# Patient Record
Sex: Female | Born: 1990 | Race: White | Hispanic: No | Smoking: Former smoker
Health system: Southern US, Community
[De-identification: ages and names within clinical notes are randomized; demographics above are authoritative.]

## PROBLEM LIST (undated history)

## (undated) DIAGNOSIS — R87619 Unspecified abnormal cytological findings in specimens from cervix uteri: Secondary | ICD-10-CM

---
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
## 2009-02-15 ENCOUNTER — Emergency Department: Payer: Self-pay | Admitting: Unknown Physician Specialty

## 2011-07-15 ENCOUNTER — Emergency Department: Payer: Self-pay | Admitting: Emergency Medicine

## 2012-12-03 ENCOUNTER — Ambulatory Visit: Payer: Self-pay | Admitting: Obstetrics and Gynecology

## 2012-12-03 ENCOUNTER — Observation Stay: Payer: Self-pay | Admitting: Obstetrics and Gynecology

## 2012-12-03 LAB — CBC WITH DIFFERENTIAL/PLATELET
Basophil #: 0 10*3/uL (ref 0.0–0.1)
Eosinophil #: 0.1 10*3/uL (ref 0.0–0.7)
HCT: 36.9 % (ref 35.0–47.0)
HGB: 12.4 g/dL (ref 12.0–16.0)
Lymphocyte #: 1.5 10*3/uL (ref 1.0–3.6)
Lymphocyte %: 13.6 %
MCH: 31.5 pg (ref 26.0–34.0)
MCHC: 33.5 g/dL (ref 32.0–36.0)
Monocyte #: 1 x10 3/mm — ABNORMAL HIGH (ref 0.2–0.9)
Neutrophil #: 8.5 10*3/uL — ABNORMAL HIGH (ref 1.4–6.5)
RBC: 3.92 10*6/uL (ref 3.80–5.20)
RDW: 14 % (ref 11.5–14.5)

## 2012-12-05 ENCOUNTER — Inpatient Hospital Stay: Payer: Self-pay

## 2012-12-05 LAB — DRUG SCREEN, URINE
Benzodiazepine, Ur Scrn: NEGATIVE (ref ?–200)
Cannabinoid 50 Ng, Ur ~~LOC~~: NEGATIVE (ref ?–50)
MDMA (Ecstasy)Ur Screen: NEGATIVE (ref ?–500)
Methadone, Ur Screen: NEGATIVE (ref ?–300)

## 2012-12-06 LAB — HEMATOCRIT: HCT: 30.1 % — ABNORMAL LOW

## 2014-09-22 ENCOUNTER — Emergency Department: Payer: Self-pay | Admitting: Emergency Medicine

## 2015-04-14 NOTE — Op Note (Signed)
PATIENT NAME:  Kathy Lynch, Kathy Lynch MR#:  [id number] DATE OF BIRTH:  1991-02-02  DATE OF PROCEDURE:  12/05/2012  PREOPERATIVE DIAGNOSES:  1. Intrauterine pregnancy at 39 weeks and zero days gestational age.  2. Homero Fellers breech presentation.  3. Labor.   POSTOPERATIVE DIAGNOSES:  1. Intrauterine pregnancy at 39 weeks and zero days gestational age.  2. Homero Fellers breech presentation.  3. Labor.   PROCEDURE: Primary low transverse cesarean section via Pfannenstiel incision.   SURGEON: Thomasene Mohair, M.D.   ASSISTANT: Deatra Robinson, CNM  ANESTHESIA: Spinal.   ESTIMATED BLOOD LOSS: 500 mL.  OPERATIVE FLUIDS: 1400 mL crystalloid.   COMPLICATIONS: None.   FINDINGS: Normal appearing gravid uterus, fallopian tubes, and ovaries.   SPECIMENS: None.   CONDITION: Stable at the end of the procedure.   INDICATIONS FOR PROCEDURE: Kathy Lynch is a 24 year old female at approximately [redacted] weeks gestational age who presents to labor and delivery with some mild vaginal bleeding as well as contractions. Over a period of time her cervical exam changed demonstrating labor which seemed to be progressing and she was therefore taken to the Operating Room.   PROCEDURE IN DETAIL: After the consents were reviewed and all of the patient's questions were answered, she was taken to the Operating Room where spinal anesthesia was administered and found to be adequate. She was placed in the dorsal supine position with a leftward tilt. She was prepped and draped in the usual sterile fashion. A Pfannenstiel incision was made and carried through the various layers until the peritoneum was identified and entered sharply. Peritoneal opening was extended in the cranial/caudal directions. A bladder flap was created and the bladder retractor was used to pull the bladder out of the operative area of interest. A low transverse hysterotomy was made and extended laterally with cranial and caudal tension. The fetal breech was grasped and elevated  through the hysterotomy and the breech was delivered followed by the body and the shoulders and head in the usual fashion without any difficulty. The cord was clamped and then cut and the infant was handed off to the awaiting pediatrician. Cord blood was collected. The placenta was then manually removed. The uterus was exteriorized and cleared of all clots and debris. The hysterotomy was closed in a running locked fashion with #0 Vicryl. A second layer of the same stitch was used to obtain hemostasis. The uterus was returned to the abdomen and the gutters were cleared of all clots and debris. The peritoneum was reapproximated using a single figure-of-eight 0 Vicryl stitch tied loosely.   At this point, the On-Q pump catheters were placed approximately 4 cm cephalad to the incision, approximately 2 cm apart straddling the midline. There were inserted according to the manufacturer's recommendations. They were inserted to a depth just superficial to the rectus abdominis muscles and just deep to the rectus fascia. They were inserted to the level of about the third mark on each catheter.   The fascia was closed using #0 Maxon using 2 stitches starting at the apices and meeting in the midline where they were tied. The skin was closed with #3-0 Vicryl, in a subcuticular fashion. The On-Q catheters were affixed to the skin using Dermabond as well as Steri-Strips and a Tegaderm. Each catheter was bolused with 5 mL of 0.5% Marcaine plain for a total of 10 mL bolus. The Pfannenstiel incision was reinforced using Steri-Strips.   The patient tolerated the procedure well. Sponge, lap, and needle counts were correct x2. For VTE prophylaxis, the  patient was wearing pneumatic compression stockings throughout the entire procedure. The patient also received 2 grams of Ancef prior to skin incision.  ____________________________ Conard NovakStephen D. Jayni Prescher, MD sdj:slb D: 12/05/2012 04:54:00 ET T: 12/05/2012 12:31:18  ET JOB#: [id number]  cc: Conard NovakStephen D. Kimmie Berggren, MD, <Dictator> Conard NovakSTEPHEN D Kaelob Persky MD ELECTRONICALLY SIGNED 01/06/2013 7:10

## 2015-05-02 NOTE — H&P (Signed)
L&D Evaluation:  History Expanded:   HPI 24 yo G3P0 at 1638 5/7 weeks, presented for pre-op visit to L&D, reports leaking fluid. C-section scheduled for 12/14 for breech.    Medications Pre Natal Vitamins    Allergies NKDA   ROS:   ROS see HPI   Exam:   Vital Signs stable    General no apparent distress    Mental Status clear    Abdomen gravid, mild contractions    Pelvic no external lesions, 1/75/-3    Mebranes Intact, fern, nitrizine & pooling negative, wet mount negative    FHT normal rate with no decels    Ucx regular, mild, q 1-2 min   Impression:   Impression intact membranes   Plan:   Comments PO hydrate, d/c home.   Electronic Signatures: Vella KohlerBrothers, Marletta Bousquet K (CNM)  (Signed 12-Dec-13 13:03)  Authored: L&D Evaluation   Last Updated: 12-Dec-13 13:03 by Vella KohlerBrothers, Falen Lehrmann K (CNM)

## 2015-05-15 ENCOUNTER — Ambulatory Visit
Admission: EM | Admit: 2015-05-15 | Discharge: 2015-05-15 | Disposition: A | Discharge status: Home / Self Care | Payer: Medicaid Other | Attending: Family Medicine | Admitting: Family Medicine

## 2015-05-15 DIAGNOSIS — J209 Acute bronchitis, unspecified: Secondary | ICD-10-CM | POA: Diagnosis not present

## 2015-05-15 MED ORDER — PSEUDOEPH-BROMPHEN-DM 30-2-10 MG/5ML PO SYRP: 10.0000 mL | ORAL_SOLUTION | Freq: Four times a day (QID) | ORAL | Status: DC | PRN

## 2015-05-15 MED ORDER — ALBUTEROL SULFATE HFA 108 (90 BASE) MCG/ACT IN AERS: 2.0000 | INHALATION_SPRAY | RESPIRATORY_TRACT | Status: DC | PRN

## 2015-05-15 MED ORDER — PREDNISONE 10 MG PO TABS: ORAL_TABLET | ORAL | Status: DC

## 2015-05-15 MED ORDER — AEROCHAMBER PLUS FLO-VU LARGE MISC: 1.0000 | Freq: Once | Status: DC

## 2015-05-15 MED ORDER — IPRATROPIUM-ALBUTEROL 0.5-2.5 (3) MG/3ML IN SOLN
3.0000 mL | Freq: Once | RESPIRATORY_TRACT | Status: AC
  Administered 2015-05-15: 3 mL via RESPIRATORY_TRACT

## 2015-05-15 NOTE — ED Notes (Signed)
Complaints of chest congestion, sore throat, pressure in face and head, body ache, dry cough x 3 days

## 2015-05-15 NOTE — ED Provider Notes (Signed)
CSN: [id number]     Arrival date & time 05/15/15  1745 History   First MD Initiated Contact with Patient 05/15/15 1900     Chief Complaint  Patient presents with  . URI   (Consider location/radiation/quality/duration/timing/severity/associated sxs/prior Treatment) HPI       24 year old female presents complaining of being sick for 3 days. This started 3 days ago as a mild sore throat. Now she has headache, congestion, rhinorrhea, burning sensation in the chest, cough, and mild body aches. Symptoms have been gradually worsening up to this point. Over-the-counter medications are not helping significantly. She is here with her boyfriend has a similar illness. She was recently around her daughter and her mother, both of them also have a similar condition.  No past medical history on file. Past Surgical History  Procedure Laterality Date  . Cesarean section     No family history on file. History  Substance Use Topics  . Smoking status: Never Smoker   . Smokeless tobacco: Not on file  . Alcohol Use: Yes     Comment: Occasionally   OB History    No data available     Review of Systems  Constitutional: Positive for fatigue. Negative for fever and chills.  HENT: Positive for congestion, rhinorrhea, sinus pressure and sore throat. Negative for ear pain.   Eyes: Negative for visual disturbance.  Respiratory: Positive for cough, chest tightness and shortness of breath. Negative for wheezing.   Cardiovascular: Negative for chest pain.  Gastrointestinal: Positive for nausea. Negative for vomiting, abdominal pain and diarrhea.  Musculoskeletal: Negative for neck stiffness.  Skin: Negative for rash.  Neurological: Positive for headaches.  All other systems reviewed and are negative.   Allergies  Review of patient's allergies indicates no known allergies.  Home Medications   Prior to Admission medications   Medication Sig Start Date End Date Taking? Authorizing Provider  Norethin  Ace-Eth Estrad-FE (LOESTRIN 24 FE PO) Take by mouth.   Yes Historical Provider, MD  albuterol (PROVENTIL HFA;VENTOLIN HFA) 108 (90 BASE) MCG/ACT inhaler Inhale 2 puffs into the lungs every 4 (four) hours as needed for wheezing. 05/15/15   Graylon Good, PA-C  brompheniramine-pseudoephedrine-DM 30-2-10 MG/5ML syrup Take 10 mLs by mouth 4 (four) times daily as needed. 05/15/15   Graylon Good, PA-C  predniSONE (DELTASONE) 10 MG tablet 4 tabs PO QD for 4 days; 3 tabs PO QD for 3 days; 2 tabs PO QD for 2 days; 1 tab PO QD for 1 day 05/15/15   Graylon Good, PA-C  Spacer/Aero-Holding Chambers (AEROCHAMBER PLUS FLO-VU LARGE) MISC 1 each by Other route once. 05/15/15   Adrian Blackwater Tallulah Hosman, PA-C   BP 125/84 mmHg  Pulse 99  Temp(Src) 99.6 F (37.6 C) (Oral)  Resp 18  Ht  (1.6 m)  Wt 112 lb (50.803 kg)  BMI 19.84 kg/m2  SpO2 100%  LMP 05/08/2015 Physical Exam  Constitutional: She is oriented to person, place, and time. Vital signs are normal. She appears well-developed and well-nourished. No distress.  HENT:  Head: Normocephalic and atraumatic.  Right Ear: External ear normal.  Left Ear: External ear normal.  Nose: Nose normal.  Mouth/Throat: Oropharynx is clear and moist. No oropharyngeal exudate.  Eyes: Conjunctivae are normal.  Neck: Normal range of motion. Neck supple.  Cardiovascular: Normal rate, regular rhythm, normal heart sounds and intact distal pulses.   Pulmonary/Chest: Effort normal and breath sounds normal. No respiratory distress. She has no wheezes. She has no rales.  Abdominal: Soft.  Lymphadenopathy:    She has no cervical adenopathy.  Neurological: She is alert and oriented to person, place, and time. She has normal strength. Coordination normal.  Skin: Skin is warm and dry. No rash noted. She is not diaphoretic.  Psychiatric: She has a normal mood and affect. Judgment normal.  Nursing note and vitals reviewed.   ED Course  Procedures (including critical care  time) Labs Review Labs Reviewed - No data to display  Imaging Review No results found.   MDM   1. Acute bronchitis, unspecified organism     Most likely bronchitis, no antibiotics indicated. She is afebrile and nontoxic, her exam is normal. She has symptomatic improvement with DuoNeb treatment. Treat symptomatically with a beer all, prednisone, cough suppressant. Strict return precautions were given, follow-up if any worsening for reevaluation.  Meds ordered this encounter  Medications  . Norethin Ace-Eth Estrad-FE (LOESTRIN 24 FE PO)    Sig: Take by mouth.  Marland Kitchen. ipratropium-albuterol (DUONEB) 0.5-2.5 (3) MG/3ML nebulizer solution 3 mL    Sig:   . predniSONE (DELTASONE) 10 MG tablet    Sig: 4 tabs PO QD for 4 days; 3 tabs PO QD for 3 days; 2 tabs PO QD for 2 days; 1 tab PO QD for 1 day    Dispense:  30 tablet    Refill:  0  . brompheniramine-pseudoephedrine-DM 30-2-10 MG/5ML syrup    Sig: Take 10 mLs by mouth 4 (four) times daily as needed.    Dispense:  120 mL    Refill:  2  . albuterol (PROVENTIL HFA;VENTOLIN HFA) 108 (90 BASE) MCG/ACT inhaler    Sig: Inhale 2 puffs into the lungs every 4 (four) hours as needed for wheezing.    Dispense:  1 Inhaler    Refill:  0  . Spacer/Aero-Holding Chambers (AEROCHAMBER PLUS FLO-VU LARGE) MISC    Sig: 1 each by Other route once.    Dispense:  1 each    Refill:  0     Graylon GoodZachary H Lareta Bruneau, PA-C 05/15/15 1951

## 2015-05-15 NOTE — Discharge Instructions (Signed)

## 2015-06-17 ENCOUNTER — Ambulatory Visit
Admission: EM | Admit: 2015-06-17 | Discharge: 2015-06-17 | Disposition: A | Discharge status: Home / Self Care | Payer: Medicaid Other | Attending: Family Medicine | Admitting: Family Medicine

## 2015-06-17 ENCOUNTER — Emergency Department: Payer: Medicaid Other

## 2015-06-17 ENCOUNTER — Encounter: Payer: Self-pay | Admitting: Emergency Medicine

## 2015-06-17 ENCOUNTER — Encounter: Payer: Self-pay | Admitting: Gynecology

## 2015-06-17 ENCOUNTER — Emergency Department
Admission: EM | Admit: 2015-06-17 | Discharge: 2015-06-17 | Disposition: A | Discharge status: Home / Self Care | Payer: Medicaid Other | Attending: Emergency Medicine | Admitting: Emergency Medicine

## 2015-06-17 DIAGNOSIS — Z3202 Encounter for pregnancy test, result negative: Secondary | ICD-10-CM | POA: Diagnosis not present

## 2015-06-17 DIAGNOSIS — R102 Pelvic and perineal pain: Secondary | ICD-10-CM | POA: Diagnosis present

## 2015-06-17 DIAGNOSIS — R109 Unspecified abdominal pain: Secondary | ICD-10-CM | POA: Diagnosis present

## 2015-06-17 DIAGNOSIS — N8320 Unspecified ovarian cysts: Secondary | ICD-10-CM | POA: Diagnosis not present

## 2015-06-17 DIAGNOSIS — R1031 Right lower quadrant pain: Secondary | ICD-10-CM | POA: Insufficient documentation

## 2015-06-17 DIAGNOSIS — N83201 Unspecified ovarian cyst, right side: Secondary | ICD-10-CM

## 2015-06-17 LAB — CBC WITH DIFFERENTIAL/PLATELET
Basophils Absolute: 0 10*3/uL (ref 0–0.1)
Basophils Relative: 0 %
EOS ABS: 0 10*3/uL (ref 0–0.7)
Eosinophils Relative: 1 %
HCT: 39.8 % (ref 35.0–47.0)
Hemoglobin: 13.7 g/dL (ref 12.0–16.0)
LYMPHS ABS: 2.1 10*3/uL (ref 1.0–3.6)
Lymphocytes Relative: 27 %
MCH: 31.8 pg (ref 26.0–34.0)
MCHC: 34.4 g/dL (ref 32.0–36.0)
MCV: 92.5 fL (ref 80.0–100.0)
Monocytes Absolute: 0.6 10*3/uL (ref 0.2–0.9)
Monocytes Relative: 8 %
Neutro Abs: 4.9 10*3/uL (ref 1.4–6.5)
Neutrophils Relative %: 64 %
PLATELETS: 155 10*3/uL (ref 150–440)
RBC: 4.3 MIL/uL (ref 3.80–5.20)
RDW: 12.3 % (ref 11.5–14.5)
WBC: 7.7 10*3/uL (ref 3.6–11.0)

## 2015-06-17 LAB — URINALYSIS COMPLETE WITH MICROSCOPIC (ARMC ONLY)
Bacteria, UA: NONE SEEN — AB
Bacteria, UA: NONE SEEN
Bilirubin Urine: NEGATIVE
Bilirubin Urine: NEGATIVE
GLUCOSE, UA: NEGATIVE mg/dL
Glucose, UA: NEGATIVE mg/dL
Hgb urine dipstick: NEGATIVE
KETONES UR: NEGATIVE mg/dL
Ketones, ur: NEGATIVE mg/dL
Leukocytes, UA: NEGATIVE
Leukocytes, UA: NEGATIVE
Nitrite: NEGATIVE
Nitrite: NEGATIVE
PROTEIN: NEGATIVE mg/dL
Protein, ur: NEGATIVE mg/dL
Specific Gravity, Urine: 1.02 (ref 1.005–1.030)
Specific Gravity, Urine: 1.016 (ref 1.005–1.030)
pH: 6.5 (ref 5.0–8.0)
pH: 7 (ref 5.0–8.0)

## 2015-06-17 LAB — CHLAMYDIA/NGC RT PCR (ARMC ONLY)
Chlamydia Tr: NOT DETECTED
N gonorrhoeae: NOT DETECTED

## 2015-06-17 LAB — PREGNANCY, URINE: Preg Test, Ur: NEGATIVE

## 2015-06-17 LAB — COMPREHENSIVE METABOLIC PANEL
ALT: 13 U/L — ABNORMAL LOW (ref 14–54)
AST: 19 U/L (ref 15–41)
Albumin: 4.2 g/dL (ref 3.5–5.0)
Alkaline Phosphatase: 41 U/L (ref 38–126)
Anion gap: 7 (ref 5–15)
BUN: 12 mg/dL (ref 6–20)
CO2: 27 mmol/L (ref 22–32)
Calcium: 9.1 mg/dL (ref 8.9–10.3)
Chloride: 104 mmol/L (ref 101–111)
Creatinine, Ser: 0.68 mg/dL (ref 0.44–1.00)
GFR calc Af Amer: >60 mL/min (ref >60)
GFR calc non Af Amer: >60 mL/min (ref >60)
Glucose, Bld: 92 mg/dL (ref 65–99)
Potassium: 4.5 mmol/L (ref 3.5–5.1)
Sodium: 138 mmol/L (ref 135–145)
Total Bilirubin: 0.4 mg/dL (ref 0.3–1.2)
Total Protein: 7 g/dL (ref 6.5–8.1)

## 2015-06-17 LAB — WET PREP, GENITAL
Clue Cells Wet Prep HPF POC: NONE SEEN
Trich, Wet Prep: NONE SEEN
Yeast Wet Prep HPF POC: NONE SEEN

## 2015-06-17 LAB — LIPASE, BLOOD: Lipase: 32 U/L (ref 22–51)

## 2015-06-17 LAB — POC URINE PREG, ED: PREG TEST UR: NEGATIVE

## 2015-06-17 MED ORDER — IBUPROFEN 800 MG PO TABS: 800.0000 mg | ORAL_TABLET | Freq: Three times a day (TID) | ORAL | Status: DC | PRN

## 2015-06-17 MED ORDER — OXYCODONE-ACETAMINOPHEN 5-325 MG PO TABS: 1.0000 | ORAL_TABLET | Freq: Four times a day (QID) | ORAL | Status: DC | PRN

## 2015-06-17 NOTE — ED Provider Notes (Signed)
[facility] Emergency Department Provider Note     Time seen: ----------------------------------------- 5:50 PM on 06/17/2015 -----------------------------------------    I have reviewed the triage vital signs and the nursing notes.   HISTORY  Chief Complaint Abdominal Pain    HPI Kathy Lynch is a 24 y.o. female who presents ER for acute right lower quadrant abdominal pain. Patient states pain started over the last several days, was worse with intercourse or exercise. She describes it as severe sharp and in the right lower quadrant and right hemipelvis. Denies any fevers chills chest pain shortness of breath, nausea vomiting or diarrhea. Patient states he did get a little better after having a bowel movement, still has mild pain this time.   History reviewed. No pertinent past medical history.  There are no active problems to display for this patient.   Past Surgical History  Procedure Laterality Date  . Cesarean section    . Cesarean section      Allergies Review of patient's allergies indicates no known allergies.  Social History History  Substance Use Topics  . Smoking status: Never Smoker   . Smokeless tobacco: Not on file  . Alcohol Use: 0.6 oz/week    1 Standard drinks or equivalent per week     Comment: Occasionally    Review of Systems Constitutional: Negative for fever. Eyes: Negative for visual changes. ENT: Negative for sore throat. Cardiovascular: Negative for chest pain. Respiratory: Negative for shortness of breath. Gastrointestinal: Positive for abdominal pain and nausea Genitourinary: Negative for dysuria. Musculoskeletal: Negative for back pain. Skin: Negative for rash. Neurological: Negative for headaches, focal weakness or numbness.  10-point ROS otherwise negative.  ____________________________________________   PHYSICAL EXAM:  VITAL SIGNS: ED Triage Vitals  Enc Vitals Group     BP 06/17/15 1544 149/73  mmHg     Pulse Rate 06/17/15 1544 63     Resp 06/17/15 1544 20     Temp 06/17/15 1544 98.5 F (36.9 C)     Temp Source 06/17/15 1544 Oral     SpO2 06/17/15 1544 98 %     Weight 06/17/15 1544 110 lb (49.896 kg)     Height 06/17/15 1544  (1.6 m)     Head Cir --      Peak Flow --      Pain Score 06/17/15 1546 4     Pain Loc --      Pain Edu? --      Excl. in GC? --     Constitutional: Alert and oriented. Well appearing and in no distress. Eyes: Conjunctivae are normal. PERRL. Normal extraocular movements. ENT   Head: Normocephalic and atraumatic.   Nose: No congestion/rhinnorhea.   Mouth/Throat: Mucous membranes are moist.   Neck: No stridor. Hematological/Lymphatic/Immunilogical: No cervical lymphadenopathy. Cardiovascular: Normal rate, regular rhythm. Normal and symmetric distal pulses are present in all extremities. No murmurs, rubs, or gallops. Respiratory: Normal respiratory effort without tachypnea nor retractions. Breath sounds are clear and equal bilaterally. No wheezes/rales/rhonchi. Gastrointestinal: Right hemipelvic tenderness, no pain in McBurney's point Genitourinary: Pelvic exam was performed, mild cervical motion tenderness and discharge, right adnexal tenderness. Musculoskeletal: Nontender with normal range of motion in all extremities. No joint effusions.  No lower extremity tenderness nor edema. Neurologic:  Normal speech and language. No gross focal neurologic deficits are appreciated. Speech is normal. No gait instability. Skin:  Skin is warm, dry and intact. No rash noted. Psychiatric: Mood and affect are normal. Speech and behavior are  normal. Patient exhibits appropriate insight and judgment. ____________________________________________  ED COURSE:  Pertinent labs & imaging results that were available during my care of the patient were reviewed by me and considered in my medical decision making (see chart for details). We'll check basic labs,  pelvic exam and perform pelvic ultrasound. Likely ruptured ovarian cyst ____________________________________________    LABS (pertinent positives/negatives)  Labs Reviewed  WET PREP, GENITAL - Abnormal; Notable for the following:    WBC, Wet Prep HPF POC FEW (*)    All other components within normal limits  COMPREHENSIVE METABOLIC PANEL - Abnormal; Notable for the following:    ALT 13 (*)    All other components within normal limits  URINALYSIS COMPLETEWITH MICROSCOPIC ([facility] ONLY) - Abnormal; Notable for the following:    Color, Urine YELLOW (*)    APPearance CLEAR (*)    Squamous Epithelial / LPF 0-5 (*)    All other components within normal limits  CHLAMYDIA/NGC RT PCR ([facility] ONLY)  CBC WITH DIFFERENTIAL/PLATELET  LIPASE, BLOOD  POC URINE PREG, ED    RADIOLOGY Pelvic ultrasound IMPRESSION: In addition to a simple right ovarian cyst there is a 3 cm complex cysts possibly representing a hemorrhagic cyst. Short-interval follow up ultrasound in 6-12 weeks is recommended, preferably during the week following the patient's normal menses.  ____________________________________________  FINAL ASSESSMENT AND PLAN  Pelvic pain, ovarian cyst  Plan: Patient with labs and ultrasound as dictated above. Patient be discharged with pain medication and an OB/GYN referral for follow-up. Wet prep was unremarkable.   Emily Filbert, MD   Emily Filbert, MD 06/17/15 [phone]

## 2015-06-17 NOTE — ED Notes (Signed)
Patient c/o lower abdomen pain x 1 week ago. Per patient sharp pain at right lower abdomen and painful intercourse x last pm.

## 2015-06-17 NOTE — ED Provider Notes (Signed)
CSN: [id number]     Arrival date & time 06/17/15  1342 History   First MD Initiated Contact with Patient 06/17/15 1415     Chief Complaint  Patient presents with  . Abdominal Cramping   (Consider location/radiation/quality/duration/timing/severity/associated sxs/prior Treatment) HPI Comments: 24 yo female with a h/o acute right lower abdominal pain, started last night after intercourse, however was worse ("severe") this morning and has continued. Denies any fevers, chills, vaginal discharge, dysuria, vomiting, diarrhea or constipation. Takes oral birth control pills, last LMP one month ago.  Patient is a 24 y.o. female presenting with cramps. The history is provided by the patient.  Abdominal Cramping    History reviewed. No pertinent past medical history. Past Surgical History  Procedure Laterality Date  . Cesarean section     No family history on file. History  Substance Use Topics  . Smoking status: Never Smoker   . Smokeless tobacco: Not on file  . Alcohol Use: Yes     Comment: Occasionally   OB History    No data available     Review of Systems  Allergies  Review of patient's allergies indicates no known allergies.  Home Medications   Prior to Admission medications   Medication Sig Start Date End Date Taking? Authorizing Provider  Norethin Ace-Eth Estrad-FE (LOESTRIN 24 FE PO) Take by mouth.   Yes Historical Provider, MD  albuterol (PROVENTIL HFA;VENTOLIN HFA) 108 (90 BASE) MCG/ACT inhaler Inhale 2 puffs into the lungs every 4 (four) hours as needed for wheezing. 05/15/15   Graylon Good, PA-C  brompheniramine-pseudoephedrine-DM 30-2-10 MG/5ML syrup Take 10 mLs by mouth 4 (four) times daily as needed. 05/15/15   Graylon Good, PA-C  predniSONE (DELTASONE) 10 MG tablet 4 tabs PO QD for 4 days; 3 tabs PO QD for 3 days; 2 tabs PO QD for 2 days; 1 tab PO QD for 1 day 05/15/15   Graylon Good, PA-C  Spacer/Aero-Holding Chambers (AEROCHAMBER PLUS FLO-VU LARGE) MISC 1  each by Other route once. 05/15/15   Adrian Blackwater Baker, PA-C   BP 113/69 mmHg  Pulse 71  Temp(Src) 98.5 F (36.9 C) (Oral)  Ht  (1.575 m)  Wt 112 lb (50.803 kg)  BMI 20.48 kg/m2  SpO2 100%  LMP 05/17/2015 (Approximate) Physical Exam  Constitutional: She appears well-developed and well-nourished. No distress.  Cardiovascular: Normal rate, regular rhythm, normal heart sounds and intact distal pulses.   No murmur heard. Pulmonary/Chest: Effort normal and breath sounds normal. No respiratory distress. She has no wheezes. She has no rales. She exhibits no tenderness.  Abdominal: Soft. Bowel sounds are normal. She exhibits no distension and no mass. There is tenderness (tenderness to palpation at McBurney's point). There is no rebound and no guarding.  Neurological: She is alert.  Skin: No rash noted. She is not diaphoretic.  Nursing note and vitals reviewed.   ED Course  Procedures (including critical care time) Labs Review Labs Reviewed  URINALYSIS COMPLETEWITH MICROSCOPIC ([facility] ONLY) - Abnormal; Notable for the following:    Hgb urine dipstick TRACE (*)    Bacteria, UA NONE SEEN (*)    Squamous Epithelial / LPF 0-5 (*)    All other components within normal limits  PREGNANCY, URINE    Imaging Review No results found.   MDM   1. Right lower quadrant abdominal pain    Discussed with patient possible etiologies including possible appendicitis. Recommend patient go to hospital ED for further evaluation and management. Patient states will  go by private vehicle.     Payton Mccallum, MD 06/17/15 [phone]

## 2015-06-17 NOTE — ED Notes (Signed)
Denies vaginal bleed 

## 2015-06-17 NOTE — Discharge Instructions (Signed)
Abdominal Pain, Women  Abdominal (stomach, pelvic, or belly) pain can be caused by many things. It is important to tell your doctor:   The location of the pain.   Does it come and go or is it present all the time?   Are there things that start the pain (eating certain foods, exercise)?   Are there other symptoms associated with the pain (fever, nausea, vomiting, diarrhea)?  All of this is helpful to know when trying to find the cause of the pain.  CAUSES    Stomach: virus or bacteria infection, or ulcer.   Intestine: appendicitis (inflamed appendix), regional ileitis (Crohn's disease), ulcerative colitis (inflamed colon), irritable bowel syndrome, diverticulitis (inflamed diverticulum of the colon), or cancer of the stomach or intestine.   Gallbladder disease or stones in the gallbladder.   Kidney disease, kidney stones, or infection.   Pancreas infection or cancer.   Fibromyalgia (pain disorder).   Diseases of the female organs:   Uterus: fibroid (non-cancerous) tumors or infection.   Fallopian tubes: infection or tubal pregnancy.   Ovary: cysts or tumors.   Pelvic adhesions (scar tissue).   Endometriosis (uterus lining tissue growing in the pelvis and on the pelvic organs).   Pelvic congestion syndrome (female organs filling up with blood just before the menstrual period).   Pain with the menstrual period.   Pain with ovulation (producing an egg).   Pain with an IUD (intrauterine device, birth control) in the uterus.   Cancer of the female organs.   Functional pain (pain not caused by a disease, may improve without treatment).   Psychological pain.   Depression.  DIAGNOSIS   Your doctor will decide the seriousness of your pain by doing an examination.   Blood tests.   X-rays.   Ultrasound.   CT scan (computed tomography, special type of X-ray).   MRI (magnetic resonance imaging).   Cultures, for infection.   Barium enema (dye inserted in the large intestine, to better view it with  X-rays).   Colonoscopy (looking in intestine with a lighted tube).   Laparoscopy (minor surgery, looking in abdomen with a lighted tube).   Major abdominal exploratory surgery (looking in abdomen with a large incision).  TREATMENT   The treatment will depend on the cause of the pain.    Many cases can be observed and treated at home.   Over-the-counter medicines recommended by your caregiver.   Prescription medicine.   Antibiotics, for infection.   Birth control pills, for painful periods or for ovulation pain.   Hormone treatment, for endometriosis.   Nerve blocking injections.   Physical therapy.   Antidepressants.   Counseling with a psychologist or psychiatrist.   Minor or major surgery.  HOME CARE INSTRUCTIONS    Do not take laxatives, unless directed by your caregiver.   Take over-the-counter pain medicine only if ordered by your caregiver. Do not take aspirin because it can cause an upset stomach or bleeding.   Try a clear liquid diet (broth or water) as ordered by your caregiver. Slowly move to a bland diet, as tolerated, if the pain is related to the stomach or intestine.   Have a thermometer and take your temperature several times a day, and record it.   Bed rest and sleep, if it helps the pain.   Avoid sexual intercourse, if it causes pain.   Avoid stressful situations.   Keep your follow-up appointments and tests, as your caregiver orders.   If the pain does   not go away with medicine or surgery, you may try:   Acupuncture.   Relaxation exercises (yoga, meditation).   Group therapy.   Counseling.  SEEK MEDICAL CARE IF:    You notice certain foods cause stomach pain.   Your home care treatment is not helping your pain.   You need stronger pain medicine.   You want your IUD removed.   You feel faint or lightheaded.   You develop nausea and vomiting.   You develop a rash.   You are having side effects or an allergy to your medicine.  SEEK IMMEDIATE MEDICAL CARE IF:    Your  pain does not go away or gets worse.   You have a fever.   Your pain is felt only in portions of the abdomen. The right side could possibly be appendicitis. The left lower portion of the abdomen could be colitis or diverticulitis.   You are passing blood in your stools (bright red or black tarry stools, with or without vomiting).   You have blood in your urine.   You develop chills, with or without a fever.   You pass out.  MAKE SURE YOU:    Understand these instructions.   Will watch your condition.   Will get help right away if you are not doing well or get worse.  Document Released: 10/06/2007 Document Revised: 04/25/2014 Document Reviewed: 10/26/2009  [employer] Patient Information 2015 [employer]. This information is not intended to replace advice given to you by your health care provider. Make sure you discuss any questions you have with your health care provider.          Ovarian Cyst  An ovarian cyst is a fluid-filled sac that forms on an ovary. The ovaries are small organs that produce eggs in women. Various types of cysts can form on the ovaries. Most are not cancerous. Many do not cause problems, and they often go away on their own. Some may cause symptoms and require treatment. Common types of ovarian cysts include:   Functional cysts--These cysts may occur every month during the menstrual cycle. This is normal. The cysts usually go away with the next menstrual cycle if the woman does not get pregnant. Usually, there are no symptoms with a functional cyst.   Endometrioma cysts--These cysts form from the tissue that lines the uterus. They are also called "chocolate cysts" because they become filled with blood that turns brown. This type of cyst can cause pain in the lower abdomen during intercourse and with your menstrual period.   Cystadenoma cysts--This type develops from the cells on the outside of the ovary. These cysts can get very big and cause lower abdomen pain and pain with  intercourse. This type of cyst can twist on itself, cut off its blood supply, and cause severe pain. It can also easily rupture and cause a lot of pain.   Dermoid cysts--This type of cyst is sometimes found in both ovaries. These cysts may contain different kinds of body tissue, such as skin, teeth, hair, or cartilage. They usually do not cause symptoms unless they get very big.   Theca lutein cysts--These cysts occur when too much of a certain hormone (human chorionic gonadotropin) is produced and overstimulates the ovaries to produce an egg. This is most common after procedures used to assist with the conception of a baby (in vitro fertilization).  CAUSES    Fertility drugs can cause a condition in which multiple large cysts are formed on the   ovaries. This is called ovarian hyperstimulation syndrome.   A condition called polycystic ovary syndrome can cause hormonal imbalances that can lead to nonfunctional ovarian cysts.  SIGNS AND SYMPTOMS   Many ovarian cysts do not cause symptoms. If symptoms are present, they may include:   Pelvic pain or pressure.   Pain in the lower abdomen.   Pain during sexual intercourse.   Increasing girth (swelling) of the abdomen.   Abnormal menstrual periods.   Increasing pain with menstrual periods.   Stopping having menstrual periods without being pregnant.  DIAGNOSIS   These cysts are commonly found during a routine or annual pelvic exam. Tests may be ordered to find out more about the cyst. These tests may include:   Ultrasound.   X-ray of the pelvis.   CT scan.   MRI.   Blood tests.  TREATMENT   Many ovarian cysts go away on their own without treatment. Your health care provider may want to check your cyst regularly for 2-3 months to see if it changes. For women in menopause, it is particularly important to monitor a cyst closely because of the higher rate of ovarian cancer in menopausal women. When treatment is needed, it may include any of the following:   A  procedure to drain the cyst (aspiration). This may be done using a long needle and ultrasound. It can also be done through a laparoscopic procedure. This involves using a thin, lighted tube with a tiny camera on the end (laparoscope) inserted through a small incision.   Surgery to remove the whole cyst. This may be done using laparoscopic surgery or an open surgery involving a larger incision in the lower abdomen.   Hormone treatment or birth control pills. These methods are sometimes used to help dissolve a cyst.  HOME CARE INSTRUCTIONS    Only take over-the-counter or prescription medicines as directed by your health care provider.   Follow up with your health care provider as directed.   Get regular pelvic exams and Pap tests.  SEEK MEDICAL CARE IF:    Your periods are late, irregular, or painful, or they stop.   Your pelvic pain or abdominal pain does not go away.   Your abdomen becomes larger or swollen.   You have pressure on your bladder or trouble emptying your bladder completely.   You have pain during sexual intercourse.   You have feelings of fullness, pressure, or discomfort in your stomach.   You lose weight for no apparent reason.   You feel generally ill.   You become constipated.   You lose your appetite.   You develop acne.   You have an increase in body and facial hair.   You are gaining weight, without changing your exercise and eating habits.   You think you are pregnant.  SEEK IMMEDIATE MEDICAL CARE IF:    You have increasing abdominal pain.   You feel sick to your stomach (nauseous), and you throw up (vomit).   You develop a fever that comes on suddenly.   You have abdominal pain during a bowel movement.   Your menstrual periods become heavier than usual.  MAKE SURE YOU:   Understand these instructions.   Will watch your condition.   Will get help right away if you are not doing well or get worse.  Document Released: 12/09/2005 Document Revised: 12/14/2013 Document  Reviewed: 08/16/2013  [employer] Patient Information 2015 [employer]. This information is not intended to replace advice given to you by   your health care provider. Make sure you discuss any questions you have with your health care provider.

## 2015-07-26 ENCOUNTER — Emergency Department
Admission: EM | Admit: 2015-07-26 | Discharge: 2015-07-26 | Disposition: A | Discharge status: Home / Self Care | Payer: Medicaid Other | Attending: Emergency Medicine | Admitting: Emergency Medicine

## 2015-07-26 ENCOUNTER — Emergency Department: Payer: Medicaid Other

## 2015-07-26 DIAGNOSIS — R102 Pelvic and perineal pain: Secondary | ICD-10-CM | POA: Diagnosis not present

## 2015-07-26 DIAGNOSIS — R1031 Right lower quadrant pain: Secondary | ICD-10-CM | POA: Diagnosis present

## 2015-07-26 DIAGNOSIS — Z79899 Other long term (current) drug therapy: Secondary | ICD-10-CM | POA: Diagnosis not present

## 2015-07-26 DIAGNOSIS — N926 Irregular menstruation, unspecified: Secondary | ICD-10-CM | POA: Diagnosis not present

## 2015-07-26 DIAGNOSIS — Z91040 Latex allergy status: Secondary | ICD-10-CM | POA: Insufficient documentation

## 2015-07-26 DIAGNOSIS — Z87891 Personal history of nicotine dependence: Secondary | ICD-10-CM | POA: Insufficient documentation

## 2015-07-26 DIAGNOSIS — Z3202 Encounter for pregnancy test, result negative: Secondary | ICD-10-CM | POA: Insufficient documentation

## 2015-07-26 HISTORY — DX: Unspecified abnormal cytological findings in specimens from cervix uteri: R87.619

## 2015-07-26 LAB — URINALYSIS COMPLETE WITH MICROSCOPIC (ARMC ONLY)
Bacteria, UA: NONE SEEN
Bilirubin Urine: NEGATIVE
Glucose, UA: NEGATIVE mg/dL
LEUKOCYTES UA: NEGATIVE
Nitrite: NEGATIVE
PH: 6 (ref 5.0–8.0)
Protein, ur: NEGATIVE mg/dL
Specific Gravity, Urine: 1.012 (ref 1.005–1.030)

## 2015-07-26 LAB — CBC WITH DIFFERENTIAL/PLATELET
BASOS ABS: 0 10*3/uL (ref 0–0.1)
BASOS PCT: 1 %
EOS PCT: 0 %
Eosinophils Absolute: 0 10*3/uL (ref 0–0.7)
HEMATOCRIT: 39.1 % (ref 35.0–47.0)
Hemoglobin: 13.4 g/dL (ref 12.0–16.0)
LYMPHS PCT: 19 %
Lymphs Abs: 0.9 10*3/uL — ABNORMAL LOW (ref 1.0–3.6)
MCH: 31.3 pg (ref 26.0–34.0)
MCHC: 34.4 g/dL (ref 32.0–36.0)
MCV: 91 fL (ref 80.0–100.0)
MONOS PCT: 14 %
Monocytes Absolute: 0.7 10*3/uL (ref 0.2–0.9)
Neutro Abs: 3.1 10*3/uL (ref 1.4–6.5)
Neutrophils Relative %: 66 %
Platelets: 137 10*3/uL — ABNORMAL LOW (ref 150–440)
RBC: 4.29 MIL/uL (ref 3.80–5.20)
RDW: 12.7 % (ref 11.5–14.5)
WBC: 4.7 10*3/uL (ref 3.6–11.0)

## 2015-07-26 LAB — COMPREHENSIVE METABOLIC PANEL
ALBUMIN: 4.4 g/dL (ref 3.5–5.0)
ALT: 18 U/L (ref 14–54)
AST: 24 U/L (ref 15–41)
Alkaline Phosphatase: 47 U/L (ref 38–126)
Anion gap: 8 (ref 5–15)
BILIRUBIN TOTAL: 0.6 mg/dL (ref 0.3–1.2)
BUN: 11 mg/dL (ref 6–20)
CHLORIDE: 103 mmol/L (ref 101–111)
CO2: 26 mmol/L (ref 22–32)
CREATININE: 0.65 mg/dL (ref 0.44–1.00)
Calcium: 9 mg/dL (ref 8.9–10.3)
GFR calc non Af Amer: >60 mL/min (ref >60)
GLUCOSE: 111 mg/dL — AB (ref 65–99)
POTASSIUM: 4 mmol/L (ref 3.5–5.1)
Sodium: 137 mmol/L (ref 135–145)
Total Protein: 7.2 g/dL (ref 6.5–8.1)

## 2015-07-26 LAB — POCT PREGNANCY, URINE: Preg Test, Ur: NEGATIVE

## 2015-07-26 LAB — PREGNANCY, URINE: Preg Test, Ur: NEGATIVE

## 2015-07-26 MED ORDER — METOCLOPRAMIDE HCL 5 MG PO TABS: 5.0000 mg | ORAL_TABLET | Freq: Three times a day (TID) | ORAL | Status: DC

## 2015-07-26 NOTE — ED Provider Notes (Signed)
[facility] Emergency Department Provider Note  ____________________________________________  Time seen: 12:40 PM  I have reviewed the triage vital signs and the nursing notes.   HISTORY  Chief Complaint Flank Pain     HPI Kathy Lynch is a 24 y.o. female who presents to the emergency department due to pelvic and abdominal pain. This is been intermittent and waxing and waning over the past 6-7 weeks. She was seen here in the emergency department on June 25 and diagnosed with a simple cyst as well as a hemorrhagic cyst on her right ovary. She reports that since that visit, she had a menstrual period that lasted an abnormally long amount of time. Today she reports ongoing discomfort. This is primarily in the right lower quadrant, right pelvic area, but it also extends into the upper right belly. It also appears migratory as well. She reports bloating. She reports intermittent loose stool.  The patient did follow-up with her gynecologist once, though insurance problems have limited her follow-up care.  Review of her ultrasound report from June 25 shows the diagnosis of the simple and the hemorrhagic cysts with a recommendation of repeat ultrasound in 6-12 weeks.    Past Medical History  Diagnosis Date  . Abnormal Pap smear of cervix     There are no active problems to display for this patient.   Past Surgical History  Procedure Laterality Date  . Cesarean section    . Cesarean section      Current Outpatient Rx  Name  Route  Sig  Dispense  Refill  . albuterol (PROVENTIL HFA;VENTOLIN HFA) 108 (90 BASE) MCG/ACT inhaler   Inhalation   Inhale 2 puffs into the lungs every 4 (four) hours as needed for wheezing.   1 Inhaler   0   . brompheniramine-pseudoephedrine-DM 30-2-10 MG/5ML syrup   Oral   Take 10 mLs by mouth 4 (four) times daily as needed.   120 mL   2   . ibuprofen (ADVIL,MOTRIN) 800 MG tablet   Oral   Take 1 tablet (800 mg total) by  mouth every 8 (eight) hours as needed.   30 tablet   0   . metoCLOPramide (REGLAN) 5 MG tablet   Oral   Take 1 tablet (5 mg total) by mouth 3 (three) times daily.   15 tablet   0   . Norethin Ace-Eth Estrad-FE (LOESTRIN 24 FE PO)   Oral   Take 1 tablet by mouth daily.          Marland Kitchen oxyCODONE-acetaminophen (ROXICET) 5-325 MG per tablet   Oral   Take 1 tablet by mouth every 6 (six) hours as needed.   20 tablet   0   . predniSONE (DELTASONE) 10 MG tablet      4 tabs PO QD for 4 days; 3 tabs PO QD for 3 days; 2 tabs PO QD for 2 days; 1 tab PO QD for 1 day   30 tablet   0   . Spacer/Aero-Holding Chambers (AEROCHAMBER PLUS FLO-VU LARGE) MISC   Other   1 each by Other route once.   1 each   0     Allergies Latex  No family history on file.  Social History History  Substance Use Topics  . Smoking status: Former Games developer  . Smokeless tobacco: Not on file  . Alcohol Use: 0.6 oz/week    1 Standard drinks or equivalent per week     Comment: Occasionally    Review of  Systems  Constitutional: Negative for fever. ENT: Negative for sore throat. Cardiovascular: Negative for chest pain. Respiratory: Negative for shortness of breath. Gastrointestinal: Negative for abdominal pain with some bloating and loose stool.  Genitourinary: Positive for pelvic pain as well as with irregular menses. See history of present illness Musculoskeletal: No myalgias or injuries. Skin: Negative for rash. Neurological: Negative for headaches   10-point ROS otherwise negative.  ____________________________________________   PHYSICAL EXAM:  VITAL SIGNS: ED Triage Vitals  Enc Vitals Group     BP 07/26/15 1057 140/97 mmHg     Pulse Rate 07/26/15 1057 81     Resp 07/26/15 1057 14     Temp 07/26/15 1057 98.4 F (36.9 C)     Temp Source 07/26/15 1057 Oral     SpO2 07/26/15 1057 100 %     Weight 07/26/15 1057 107 lb (48.535 kg)     Height 07/26/15 1057 5\' 3"  (1.6 m)     Head Cir --       Peak Flow --      Pain Score 07/26/15 1058 4     Pain Loc --      Pain Edu? --      Excl. in GC? --     Constitutional:  Alert and oriented. Patient is sitting upright and looking somewhat uncomfortable in bed but is in no acute distress. ENT   Head: Normocephalic and atraumatic.   Nose: No congestion/rhinnorhea.   Mouth/Throat: Mucous membranes are moist. Cardiovascular: Normal rate, regular rhythm, no murmur noted Respiratory:  Normal respiratory effort, no tachypnea.    Breath sounds are clear and equal bilaterally.  Gastrointestinal: Soft. No tenderness in the upper abdomen but there is some tenderness in the right lower quadrant/right pelvic area. No distention.  Back: No muscle spasm, no tenderness, no CVA tenderness. Musculoskeletal: No deformity noted. Nontender with normal range of motion in all extremities.  No noted edema. Neurologic:  Normal speech and language. No gross focal neurologic deficits are appreciated.  Skin:  Skin is warm, dry. No rash noted. Psychiatric: Mood and affect are normal. Speech and behavior are normal.  ____________________________________________    LABS (pertinent positives/negatives)  Labs Reviewed  CBC WITH DIFFERENTIAL/PLATELET - Abnormal; Notable for the following:    Platelets 137 (*)    Lymphs Abs 0.9 (*)    All other components within normal limits  COMPREHENSIVE METABOLIC PANEL - Abnormal; Notable for the following:    Glucose, Bld 111 (*)    All other components within normal limits  URINALYSIS COMPLETEWITH MICROSCOPIC ([facility] ONLY) - Abnormal; Notable for the following:    Color, Urine YELLOW (*)    APPearance CLEAR (*)    Ketones, ur TRACE (*)    Hgb urine dipstick 1+ (*)    Squamous Epithelial / LPF 0-5 (*)    All other components within normal limits  PREGNANCY, URINE  POCT PREGNANCY, URINE     ____________________________________________  RADIOLOGY  Pelvic ultrasound:  IMPRESSION: Interval resolution of  hemorrhagic right ovarian cyst since prior study. No evidence of pelvic mass or other acute findings.  ____________________________________________   PROCEDURES  None  ____________________________________________   INITIAL IMPRESSION / ASSESSMENT AND PLAN / ED COURSE  Pertinent labs & imaging results that were available during my care of the patient were reviewed by me and considered in my medical decision making (see chart for details).   24 year old female with ongoing symptoms of abdominal pain pelvic pain. This is likely due to the previously  diagnosis simple and hemorrhagic cyst. We will repeat the pelvic ultrasound today to look for further bleeding or free fluid in the pelvis that would contribute to this pain. We will reassess lab values.  ----------------------------------------- 3:49 PM on 07/26/2015 -----------------------------------------  Ultrasound shows some resolution of her hemorrhagic right ovarian cyst with no other acute findings.  At this time the patient has finds her sitting up looking quite comfortable. Her young daughter is with her. I have reviewed the patient's ultrasound findings and lab findings with her. With the gynecological issues addressed, there is still a question of distention and bloating and discomfort in her belly. I do not think CT scan would be indicated given her good appearance. She has nausea occasionally. I will prescribe Reglan, 5 mg. I have discussed the risk and benefits of Reglan with her.  The patient will follow-up with her regular doctor at [facility] primary care as well as with Dr. Jean Rosenthal at [facility].  ____________________________________________   FINAL CLINICAL IMPRESSION(S) / ED DIAGNOSES  Final diagnoses:  Female pelvic pain  Pelvic pain in female      Darien Ramus, MD 07/26/15 [phone]

## 2015-07-26 NOTE — ED Notes (Signed)
Patient transported to Ultrasound 

## 2015-07-26 NOTE — Discharge Instructions (Signed)
Your ultrasound showed some resolution of the hemorrhagic cyst that was seen 5-6 weeks ago. Follow-up with Dr. Jean Rosenthal as well as with your primary doctor as scheduled. Take Reglan if needed for nausea. Return to the emergency department if you have worsening symptoms or other urgent concerns.  Pelvic Pain Female pelvic pain can be caused by many different things and start from a variety of places. Pelvic pain refers to pain that is located in the lower half of the abdomen and between your hips. The pain may occur over a short period of time (acute) or may be reoccurring (chronic). The cause of pelvic pain may be related to disorders affecting the female reproductive organs (gynecologic), but it may also be related to the bladder, kidney stones, an intestinal complication, or muscle or skeletal problems. Getting help right away for pelvic pain is important, especially if there has been severe, sharp, or a sudden onset of unusual pain. It is also important to get help right away because some types of pelvic pain can be life threatening.  CAUSES  Below are only some of the causes of pelvic pain. The causes of pelvic pain can be in one of several categories.   Gynecologic.  Pelvic inflammatory disease.  Sexually transmitted infection.  Ovarian cyst or a twisted ovarian ligament (ovarian torsion).  Uterine lining that grows outside the uterus (endometriosis).  Fibroids, cysts, or tumors.  Ovulation.  Pregnancy.  Pregnancy that occurs outside the uterus (ectopic pregnancy).  Miscarriage.  Labor.  Abruption of the placenta or ruptured uterus.  Infection.  Uterine infection (endometritis).  Bladder infection.  Diverticulitis.  Miscarriage related to a uterine infection (septic abortion).  Bladder.  Inflammation of the bladder (cystitis).  Kidney stone(s).  Gastrointestinal.  Constipation.  Diverticulitis.  Neurologic.  Trauma.  Feeling pelvic pain because of  mental or emotional causes (psychosomatic).  Cancers of the bowel or pelvis. EVALUATION  Your caregiver will want to take a careful history of your concerns. This includes recent changes in your health, a careful gynecologic history of your periods (menses), and a sexual history. Obtaining your family history and medical history is also important. Your caregiver may suggest a pelvic exam. A pelvic exam will help identify the location and severity of the pain. It also helps in the evaluation of which organ system may be involved. In order to identify the cause of the pelvic pain and be properly treated, your caregiver may order tests. These tests may include:   A pregnancy test.  Pelvic ultrasonography.  An X-ray exam of the abdomen.  A urinalysis or evaluation of vaginal discharge.  Blood tests. HOME CARE INSTRUCTIONS   Only take over-the-counter or prescription medicines for pain, discomfort, or fever as directed by your caregiver.   Rest as directed by your caregiver.   Eat a balanced diet.   Drink enough fluids to make your urine clear or pale yellow, or as directed.   Avoid sexual intercourse if it causes pain.   Apply warm or cold compresses to the lower abdomen depending on which one helps the pain.   Avoid stressful situations.   Keep a journal of your pelvic pain. Write down when it started, where the pain is located, and if there are things that seem to be associated with the pain, such as food or your menstrual cycle.  Follow up with your caregiver as directed.  SEEK MEDICAL CARE IF:  Your medicine does not help your pain.  You have abnormal vaginal discharge. SEEK  IMMEDIATE MEDICAL CARE IF:   You have heavy bleeding from the vagina.   Your pelvic pain increases.   You feel light-headed or faint.   You have chills.   You have pain with urination or blood in your urine.   You have uncontrolled diarrhea or vomiting.   You have a fever or  persistent symptoms for more than 3 days.  You have a fever and your symptoms suddenly get worse.   You are being physically or sexually abused.  MAKE SURE YOU:  Understand these instructions.  Will watch your condition.  Will get help if you are not doing well or get worse. Document Released: 11/05/2004 Document Revised: 04/25/2014 Document Reviewed: 03/30/2012 [facility] Patient Information 2015 [place], [place]. This information is not intended to replace advice given to you by your health care provider. Make sure you discuss any questions you have with your health care provider.

## 2015-07-26 NOTE — ED Notes (Signed)
Pt reports to ED w/ c/o R side flank pain and bloating.  Pt reports having a ruptured cyst 6 wks ago.  Pt denies N/V.

## 2015-08-03 ENCOUNTER — Emergency Department
Admission: EM | Admit: 2015-08-03 | Discharge: 2015-08-03 | Disposition: A | Discharge status: Home / Self Care | Payer: Medicaid Other | Attending: Emergency Medicine | Admitting: Emergency Medicine

## 2015-08-03 ENCOUNTER — Encounter: Payer: Self-pay | Admitting: Emergency Medicine

## 2015-08-03 DIAGNOSIS — Z91040 Latex allergy status: Secondary | ICD-10-CM | POA: Insufficient documentation

## 2015-08-03 DIAGNOSIS — Z87891 Personal history of nicotine dependence: Secondary | ICD-10-CM | POA: Diagnosis not present

## 2015-08-03 DIAGNOSIS — K611 Rectal abscess: Secondary | ICD-10-CM | POA: Diagnosis not present

## 2015-08-03 DIAGNOSIS — Z791 Long term (current) use of non-steroidal anti-inflammatories (NSAID): Secondary | ICD-10-CM | POA: Insufficient documentation

## 2015-08-03 DIAGNOSIS — R590 Localized enlarged lymph nodes: Secondary | ICD-10-CM

## 2015-08-03 DIAGNOSIS — Z79899 Other long term (current) drug therapy: Secondary | ICD-10-CM | POA: Insufficient documentation

## 2015-08-03 MED ORDER — SULFAMETHOXAZOLE-TRIMETHOPRIM 800-160 MG PO TABS: 1.0000 | ORAL_TABLET | Freq: Two times a day (BID) | ORAL | Status: DC

## 2015-08-03 MED ORDER — OXYCODONE-ACETAMINOPHEN 5-325 MG PO TABS: 1.0000 | ORAL_TABLET | Freq: Four times a day (QID) | ORAL | Status: DC | PRN

## 2015-08-03 NOTE — ED Provider Notes (Signed)
[facility] Emergency Department Provider Note ____________________________________________  Time seen: Approximately 10:37 AM  I have reviewed the triage vital signs and the nursing notes.   HISTORY  Chief Complaint Abscess   HPI Kathy Lynch is a 24 y.o. female who presents to the emergency department with complaint of abscess in the rectal area. Symptoms been present for the past 3 days. She has had some relief with ibuprofen. She has taken 2 sitz baths with Epsom salt. She is also complaining of feeling "knots in the groin area on the right side."   Past Medical History  Diagnosis Date  . Abnormal Pap smear of cervix     There are no active problems to display for this patient.   Past Surgical History  Procedure Laterality Date  . Cesarean section    . Cesarean section      Current Outpatient Rx  Name  Route  Sig  Dispense  Refill  . albuterol (PROVENTIL HFA;VENTOLIN HFA) 108 (90 BASE) MCG/ACT inhaler   Inhalation   Inhale 2 puffs into the lungs every 4 (four) hours as needed for wheezing.   1 Inhaler   0   . brompheniramine-pseudoephedrine-DM 30-2-10 MG/5ML syrup   Oral   Take 10 mLs by mouth 4 (four) times daily as needed.   120 mL   2   . ibuprofen (ADVIL,MOTRIN) 800 MG tablet   Oral   Take 1 tablet (800 mg total) by mouth every 8 (eight) hours as needed.   30 tablet   0   . metoCLOPramide (REGLAN) 5 MG tablet   Oral   Take 1 tablet (5 mg total) by mouth 3 (three) times daily.   15 tablet   0   . Norethin Ace-Eth Estrad-FE (LOESTRIN 24 FE PO)   Oral   Take 1 tablet by mouth daily.          Marland Kitchen oxyCODONE-acetaminophen (ROXICET) 5-325 MG per tablet   Oral   Take 1 tablet by mouth every 6 (six) hours as needed.   9 tablet   0   . predniSONE (DELTASONE) 10 MG tablet      4 tabs PO QD for 4 days; 3 tabs PO QD for 3 days; 2 tabs PO QD for 2 days; 1 tab PO QD for 1 day   30 tablet   0   . Spacer/Aero-Holding Chambers  (AEROCHAMBER PLUS FLO-VU LARGE) MISC   Other   1 each by Other route once.   1 each   0   . sulfamethoxazole-trimethoprim (BACTRIM DS,SEPTRA DS) 800-160 MG per tablet   Oral   Take 1 tablet by mouth 2 (two) times daily.   20 tablet   0     Allergies Latex  History reviewed. No pertinent family history.  Social History Social History  Substance Use Topics  . Smoking status: Former Games developer  . Smokeless tobacco: None  . Alcohol Use: 0.6 oz/week    1 Standard drinks or equivalent per week     Comment: Occasionally    Review of Systems   Constitutional: No fever/chills Eyes: No visual changes. ENT: No congestion or rhinorrhea Cardiovascular: Denies chest pain. Respiratory: Denies shortness of breath. Gastrointestinal: No abdominal pain.  No nausea, no vomiting.  No diarrhea.  No constipation. Genitourinary: Negative for dysuria. Musculoskeletal: Negative for back pain. Skin: Abscess to the rectal area Neurological: Negative for headaches, focal weakness or numbness.  10-point ROS otherwise negative.  ____________________________________________   PHYSICAL EXAM:  VITAL SIGNS: ED Triage Vitals  Enc Vitals Group     BP 08/03/15 1018 133/93 mmHg     Pulse Rate 08/03/15 1018 80     Resp 08/03/15 1018 20     Temp 08/03/15 1018 98.7 F (37.1 C)     Temp Source 08/03/15 1018 Oral     SpO2 08/03/15 1018 100 %     Weight 08/03/15 1018 109 lb (49.442 kg)     Height 08/03/15 1018  (1.6 m)     Head Cir --      Peak Flow --      Pain Score 08/03/15 1019 8     Pain Loc --      Pain Edu? --      Excl. in GC? --     Constitutional: Alert and oriented. Well appearing and in no acute distress. Eyes: Conjunctivae are normal. PERRL. EOMI. Head: Atraumatic. Nose: No congestion/rhinnorhea. Mouth/Throat: Mucous membranes are moist.  Oropharynx non-erythematous. No oral lesions. Neck: No stridor. Cardiovascular: Normal rate, regular rhythm.  Good peripheral  circulation. Respiratory: Normal respiratory effort.  No retractions. Lungs CTAB. Gastrointestinal: Soft and nontender. No distention. No abdominal bruits.  Musculoskeletal: No lower extremity tenderness nor edema.  No joint effusions. Lymphatics: Chain of nodes palpable to right inguinal area that are tender to touch. No erythema or cellulitic appearing areas noted.  Neurologic:  Normal speech and language. No gross focal neurologic deficits are appreciated. Speech is normal. No gait instability. Skin: Perirectal abscess at 1 o'clock position that is freely draining purulent fluid. Mild erythema in surrounding area.  Psychiatric: Mood and affect are normal. Speech and behavior are normal.  ____________________________________________   LABS (all labs ordered are listed, but only abnormal results are displayed)  Labs Reviewed - No data to display ____________________________________________  EKG   ____________________________________________  RADIOLOGY  Not indicated ____________________________________________   PROCEDURES  Procedure(s) performed: None ____________________________________________   INITIAL IMPRESSION / ASSESSMENT AND PLAN / ED COURSE  Pertinent labs & imaging results that were available during my care of the patient were reviewed by me and considered in my medical decision making (see chart for details).  Because the abscess is freely draining, no I&D is necessary today. Patient was strongly advised to schedule follow-up with surgery if the abscess stops draining and remains tender, red, or swollen. She was also advised to return to the emergency department if she feels that she is not improving with the antibiotics and is unable to schedule an appointment with surgery. She was encouraged to take the antibiotic until finished even if she feels better. ____________________________________________   FINAL CLINICAL IMPRESSION(S) / ED DIAGNOSES  Final  diagnoses:  Rectal abscess  Inguinal lymphadenopathy       Chinita Pester, FNP 08/03/15 1425  Emily Filbert, MD 08/03/15 1558

## 2015-08-03 NOTE — ED Notes (Signed)
Patient to ED with c/o abscess to rectal area x3 days. Also believes another one may be starting in bikini line.

## 2015-08-03 NOTE — ED Notes (Signed)
Developed possible abscess area to buttocks/rectal area about 3 days ago.

## 2015-08-03 NOTE — Discharge Instructions (Signed)
Take the antibiotic until finished. If you are not improving over the next 48 hours, call to schedule an appointment with surgery. If you are getting worse and unable to see the surgeon return to the emergency department. Continue the sitz baths 4 times a day.

## 2015-08-18 ENCOUNTER — Ambulatory Visit
Admission: EM | Admit: 2015-08-18 | Discharge: 2015-08-18 | Disposition: A | Discharge status: Home / Self Care | Payer: Medicaid Other | Attending: Family Medicine | Admitting: Family Medicine

## 2015-08-18 ENCOUNTER — Encounter: Payer: Self-pay | Admitting: Emergency Medicine

## 2015-08-18 DIAGNOSIS — R42 Dizziness and giddiness: Secondary | ICD-10-CM | POA: Diagnosis not present

## 2015-08-18 LAB — CBC WITH DIFFERENTIAL/PLATELET
BASOS ABS: 0.1 10*3/uL (ref 0–0.1)
Basophils Relative: 1 %
EOS PCT: 1 %
Eosinophils Absolute: 0 10*3/uL (ref 0–0.7)
HEMATOCRIT: 39 % (ref 35.0–47.0)
Hemoglobin: 13.4 g/dL (ref 12.0–16.0)
LYMPHS ABS: 1.9 10*3/uL (ref 1.0–3.6)
LYMPHS PCT: 33 %
MCH: 31.1 pg (ref 26.0–34.0)
MCHC: 34.4 g/dL (ref 32.0–36.0)
MCV: 90.4 fL (ref 80.0–100.0)
MONOS PCT: 9 %
Monocytes Absolute: 0.5 10*3/uL (ref 0.2–0.9)
NEUTROS ABS: 3.2 10*3/uL (ref 1.4–6.5)
Neutrophils Relative %: 56 %
Platelets: 184 10*3/uL (ref 150–440)
RBC: 4.31 MIL/uL (ref 3.80–5.20)
RDW: 12.8 % (ref 11.5–14.5)
WBC: 5.7 10*3/uL (ref 3.6–11.0)

## 2015-08-18 LAB — BASIC METABOLIC PANEL
ANION GAP: 9 (ref 5–15)
BUN: 9 mg/dL (ref 6–20)
CALCIUM: 9.4 mg/dL (ref 8.9–10.3)
CO2: 29 mmol/L (ref 22–32)
Chloride: 98 mmol/L — ABNORMAL LOW (ref 101–111)
Creatinine, Ser: 0.68 mg/dL (ref 0.44–1.00)
GFR calc Af Amer: >60 mL/min (ref >60)
GFR calc non Af Amer: >60 mL/min (ref >60)
Glucose, Bld: 116 mg/dL — ABNORMAL HIGH (ref 65–99)
Potassium: 3.5 mmol/L (ref 3.5–5.1)
Sodium: 136 mmol/L (ref 135–145)

## 2015-08-18 LAB — HCG, QUANTITATIVE, PREGNANCY: hCG, Beta Chain, Quant, S: <1 m[IU]/mL (ref <5)

## 2015-08-18 NOTE — ED Provider Notes (Signed)
CSN: [id number]     Arrival date & time 08/18/15  1133 History   First MD Initiated Contact with Patient 08/18/15 1151     Chief Complaint  Patient presents with  . Dizziness   (Consider location/radiation/quality/duration/timing/severity/associated sxs/prior Treatment) HPI Comments: 24 yo female with a 1 week h/o intermittent dizziness, worse with position changes, head movement.  Also states has felt some shortness of breath. Denies any chest pains, palpitations, fevers, chills, dysuria, vomiting, headaches, vision changes, ear pain.  LMP was about 3 weeks ago (normal).   Patient is a 24 y.o. female presenting with dizziness. The history is provided by the patient.  Dizziness   Past Medical History  Diagnosis Date  . Abnormal Pap smear of cervix    Past Surgical History  Procedure Laterality Date  . Cesarean section    . Cesarean section     History reviewed. No pertinent family history. Social History  Substance Use Topics  . Smoking status: Former Games developer  . Smokeless tobacco: None  . Alcohol Use: 0.6 oz/week    1 Standard drinks or equivalent per week     Comment: Occasionally   OB History    No data available     Review of Systems  Neurological: Positive for dizziness.    Allergies  Latex  Home Medications   Prior to Admission medications   Medication Sig Start Date End Date Taking? Authorizing Provider  albuterol (PROVENTIL HFA;VENTOLIN HFA) 108 (90 BASE) MCG/ACT inhaler Inhale 2 puffs into the lungs every 4 (four) hours as needed for wheezing. 05/15/15   Graylon Good, PA-C  brompheniramine-pseudoephedrine-DM 30-2-10 MG/5ML syrup Take 10 mLs by mouth 4 (four) times daily as needed. 05/15/15   Graylon Good, PA-C  ibuprofen (ADVIL,MOTRIN) 800 MG tablet Take 1 tablet (800 mg total) by mouth every 8 (eight) hours as needed. 06/17/15   Emily Filbert, MD  metoCLOPramide (REGLAN) 5 MG tablet Take 1 tablet (5 mg total) by mouth 3 (three) times daily. 07/26/15    Darien Ramus, MD  Norethin Ace-Eth Estrad-FE (LOESTRIN 24 FE PO) Take 1 tablet by mouth daily.     Historical Provider, MD  oxyCODONE-acetaminophen (ROXICET) 5-325 MG per tablet Take 1 tablet by mouth every 6 (six) hours as needed. 08/03/15   Chinita Pester, FNP  predniSONE (DELTASONE) 10 MG tablet 4 tabs PO QD for 4 days; 3 tabs PO QD for 3 days; 2 tabs PO QD for 2 days; 1 tab PO QD for 1 day 05/15/15   Graylon Good, PA-C  Spacer/Aero-Holding Chambers (AEROCHAMBER PLUS FLO-VU LARGE) MISC 1 each by Other route once. 05/15/15   Graylon Good, PA-C  sulfamethoxazole-trimethoprim (BACTRIM DS,SEPTRA DS) 800-160 MG per tablet Take 1 tablet by mouth 2 (two) times daily. 08/03/15   Chinita Pester, FNP   Meds Ordered and Administered this Visit  Medications - No data to display  BP 112/92 mmHg  Pulse 76  Temp(Src) 98 F (36.7 C) (Tympanic)  Resp 18  Ht 5\' 3"  (1.6 m)  Wt 108 lb (48.988 kg)  BMI 19.14 kg/m2  SpO2 100%  LMP 07/28/2015 Orthostatic VS for the past 24 hrs:  BP- Lying Pulse- Lying BP- Sitting Pulse- Sitting BP- Standing at 0 minutes Pulse- Standing at 0 minutes  08/18/15 1204 111/72 mmHg 76 115/83 mmHg 104 112/86 mmHg 117    Physical Exam  Constitutional: She appears well-developed and well-nourished. No distress.  HENT:  Head: Normocephalic and atraumatic.  Right Ear:  External ear and ear canal normal. A middle ear effusion is present.  Left Ear: External ear and ear canal normal. A middle ear effusion is present.  Nose: Nose normal.  Mouth/Throat: Uvula is midline, oropharynx is clear and moist and mucous membranes are normal.  Eyes: Conjunctivae and EOM are normal. Pupils are equal, round, and reactive to light. Right eye exhibits no discharge. Left eye exhibits no discharge. No scleral icterus.  Neck: Normal range of motion. Neck supple. No JVD present. No tracheal deviation present. No thyromegaly present.  Cardiovascular: Normal rate, regular rhythm, normal heart  sounds and intact distal pulses.   No murmur heard. Pulmonary/Chest: Effort normal and breath sounds normal. No stridor. No respiratory distress. She has no wheezes. She has no rales. She exhibits no tenderness.  Abdominal: Soft. Bowel sounds are normal. She exhibits no distension. There is no tenderness.  Musculoskeletal: She exhibits no edema or tenderness.  Lymphadenopathy:    She has no cervical adenopathy.  Neurological: She is alert.  Skin: No rash noted. She is not diaphoretic.  Nursing note and vitals reviewed.   ED Course  Procedures (including critical care time)  Labs Review Labs Reviewed  BASIC METABOLIC PANEL - Abnormal; Notable for the following:    Chloride 98 (*)    Glucose, Bld 116 (*)    All other components within normal limits  CBC WITH DIFFERENTIAL/PLATELET  HCG, QUANTITATIVE, PREGNANCY    Imaging Review No results found.   Visual Acuity Review  Right Eye Distance:   Left Eye Distance:   Bilateral Distance:    Right Eye Near:   Left Eye Near:    Bilateral Near:         MDM   1. Dizziness   (likely benign positional vertigo)  Plan: 1. Test results (CBC, BMP normal) and diagnosis reviewed with patient; pregnancy test (serum hCG) pending 2.  Recommend vestibular exercises; otc antihistamines prn 3. F/u prn   Payton Mccallum, MD 08/18/15 1359

## 2015-08-18 NOTE — ED Notes (Signed)
Pt withdizziness with standing up x 1 week

## 2015-08-21 ENCOUNTER — Ambulatory Visit
Admission: EM | Admit: 2015-08-21 | Discharge: 2015-08-21 | Discharge status: Absent without leave | Payer: Medicaid Other

## 2015-08-26 ENCOUNTER — Encounter: Payer: Self-pay | Admitting: Gynecology

## 2015-08-26 ENCOUNTER — Ambulatory Visit
Admission: EM | Admit: 2015-08-26 | Discharge: 2015-08-26 | Disposition: A | Discharge status: Home / Self Care | Payer: Medicaid Other | Attending: Internal Medicine | Admitting: Internal Medicine

## 2015-08-26 DIAGNOSIS — Z79899 Other long term (current) drug therapy: Secondary | ICD-10-CM | POA: Diagnosis not present

## 2015-08-26 DIAGNOSIS — Z87891 Personal history of nicotine dependence: Secondary | ICD-10-CM | POA: Diagnosis not present

## 2015-08-26 DIAGNOSIS — L509 Urticaria, unspecified: Secondary | ICD-10-CM | POA: Diagnosis not present

## 2015-08-26 LAB — HCG, QUANTITATIVE, PREGNANCY: hCG, Beta Chain, Quant, S: <1 m[IU]/mL (ref <5)

## 2015-08-26 NOTE — ED Notes (Signed)
Pt states hives on stomach, arms, legs.  Was on doxycycline and finished (8/25), day after broke out in hives.  Still has hives and has intermittent chest pain.

## 2015-08-26 NOTE — Discharge Instructions (Signed)
Zyrtec ( ceterizine) 1 a day ( twice a day if needed ) for   3-5 days  relief urticaria  Zantac ( ranitidine ) Hives Hives are itchy, red, swollen areas of the skin. They can vary in size and location on your body. Hives can come and go for hours or several days (acute hives) or for several weeks (chronic hives). Hives do not spread from person to person (noncontagious). They may get worse with scratching, exercise, and emotional stress. CAUSES   Allergic reaction to food, additives, or drugs.  Infections, including the common cold.  Illness, such as vasculitis, lupus, or thyroid disease.  Exposure to sunlight, heat, or cold.  Exercise.  Stress.  Contact with chemicals. SYMPTOMS   Red or white swollen patches on the skin. The patches may change size, shape, and location quickly and repeatedly.  Itching.  Swelling of the hands, feet, and face. This may occur if hives develop deeper in the skin. DIAGNOSIS  Your caregiver can usually tell what is wrong by performing a physical exam. Skin or blood tests may also be done to determine the cause of your hives. In some cases, the cause cannot be determined. TREATMENT  Mild cases usually get better with medicines such as antihistamines. Severe cases may require an emergency epinephrine injection. If the cause of your hives is known, treatment includes avoiding that trigger.  HOME CARE INSTRUCTIONS   Avoid causes that trigger your hives.  Take antihistamines as directed by your caregiver to reduce the severity of your hives. Non-sedating or low-sedating antihistamines are usually recommended. Do not drive while taking an antihistamine.  Take any other medicines prescribed for itching as directed by your caregiver.  Wear loose-fitting clothing.  Keep all follow-up appointments as directed by your caregiver. SEEK MEDICAL CARE IF:   You have persistent or severe itching that is not relieved with medicine.  You have painful or swollen  joints. SEEK IMMEDIATE MEDICAL CARE IF:   You have a fever.  Your tongue or lips are swollen.  You have trouble breathing or swallowing.  You feel tightness in the throat or chest.  You have abdominal pain. These problems may be the first sign of a life-threatening allergic reaction. Call your local emergency services (911 in [place] MAKE SURE YOU:   Understand these instructions.  Will watch your condition.  Will get help right away if you are not doing well or get worse. Document Released: 12/09/2005 Document Revised: 12/14/2013 Document Reviewed: 03/03/2012 [facility] Patient Information 2015 [place], [place]. This information is not intended to replace advice given to you by your health care provider. Make sure you discuss any questions you have with your health care provider. 150 mg 1 twice daily with Zyrtec

## 2015-08-31 ENCOUNTER — Other Ambulatory Visit: Payer: Self-pay | Admitting: Family Medicine

## 2015-08-31 DIAGNOSIS — R1011 Right upper quadrant pain: Secondary | ICD-10-CM

## 2015-08-31 NOTE — ED Provider Notes (Signed)
CSN: [id number]     Arrival date & time 08/26/15  1316 History   First MD Initiated Contact with Patient 08/26/15 1403     Chief Complaint  Patient presents with  . Urticaria   (Consider location/radiation/quality/duration/timing/severity/associated sxs/prior Treatment) HPI 24 yo F presents with hives. She has had allergies to TMP-SMZ in the past 08/03/15. Recently completed a course of Doxycycline 8/25 and reports that hives started the day after. They have been coming and going for the interim. She was recently seen for dizziness and felt to have inner ear issues. Had occasional fleeting chest discomfort in past but denies that it is present today.No cough.  Her nasal congestion  comes and goes as well-has not ever thought about seasonal allergies but has some itchy eyes and scratchy throat symptoms as well.   Otherwise generally feels well. LMP 5 August    no BC  prenatals  Past Medical History  Diagnosis Date  . Abnormal Pap smear of cervix    Past Surgical History  Procedure Laterality Date  . Cesarean section    . Cesarean section     History reviewed. No pertinent family history. Social History  Substance Use Topics  . Smoking status: Former Games developer  . Smokeless tobacco: None  . Alcohol Use: No     Comment: Occasionally   OB History    No data available     Review of Systems Review of 10 systems negative for acute change except as referenced in HPI  Allergies  Latex and Sulfa antibiotics  Home Medications   Prior to Admission medications   Medication Sig Start Date End Date Taking? Authorizing Provider  albuterol (PROVENTIL HFA;VENTOLIN HFA) 108 (90 BASE) MCG/ACT inhaler Inhale 2 puffs into the lungs every 4 (four) hours as needed for wheezing. 05/15/15   Graylon Good, PA-C  brompheniramine-pseudoephedrine-DM 30-2-10 MG/5ML syrup Take 10 mLs by mouth 4 (four) times daily as needed. 05/15/15   Graylon Good, PA-C  ibuprofen (ADVIL,MOTRIN) 800 MG tablet Take 1 tablet  (800 mg total) by mouth every 8 (eight) hours as needed. 06/17/15   Emily Filbert, MD  metoCLOPramide (REGLAN) 5 MG tablet Take 1 tablet (5 mg total) by mouth 3 (three) times daily. 07/26/15   Darien Ramus, MD  Norethin Ace-Eth Estrad-FE (LOESTRIN 24 FE PO) Take 1 tablet by mouth daily.     Historical Provider, MD  oxyCODONE-acetaminophen (ROXICET) 5-325 MG per tablet Take 1 tablet by mouth every 6 (six) hours as needed. 08/03/15   Chinita Pester, FNP  predniSONE (DELTASONE) 10 MG tablet 4 tabs PO QD for 4 days; 3 tabs PO QD for 3 days; 2 tabs PO QD for 2 days; 1 tab PO QD for 1 day 05/15/15   Graylon Good, PA-C  Spacer/Aero-Holding Chambers (AEROCHAMBER PLUS FLO-VU LARGE) MISC 1 each by Other route once. 05/15/15   Graylon Good, PA-C  sulfamethoxazole-trimethoprim (BACTRIM DS,SEPTRA DS) 800-160 MG per tablet Take 1 tablet by mouth 2 (two) times daily. 08/03/15   Chinita Pester, FNP   Meds Ordered and Administered this Visit  Medications - No data to display  BP 142/103 mmHg  Pulse 92  Temp(Src) 98.2 F (36.8 C) (Oral)  Ht  (1.6 m)  Wt 109 lb (49.442 kg)  BMI 19.31 kg/m2  SpO2 100%  LMP 07/28/2015 No data found.   Physical Exam   Constitutional -alert and oriented,well appearing and in no acute distress Head-atraumatic, normocephalic Eyes- conjunctiva normal, EOMI ,  conjugate gaze Ears- canals negative, no erythema TMs, TMs dull, no fluid visualized Nose- no congestion or rhinorrhea Mouth/throat- mucous membranes moist ,oropharynx non-erythematous Neck- supple without glandular enlargement CV- regular rate, grossly normal heart sounds,  Resp-no distress, normal respiratory effort,clear to auscultation bilaterally GI- soft,non-tender,no distention GU-  not examined LMP 5 August [facility]- no tender, normal ROM, all extremities, ambulatory, self-care Neuro- normal speech and language, no gross focal neurological deficit appreciated, no gait instability, Skin-warm,dry  ,intact; areas of resolving hives extremities and abdomen, faint and slightly more erythematous Psych-mood and affect grossly normal; speech and behavior grossly normal ED Course  Procedures (including critical care time)  Labs Review Labs Reviewed  HCG, QUANTITATIVE, PREGNANCY  Neg  Pregnancy test negative. Will add Zyrtec and Zantac to help with hives. Antibiotics are behind her now. Possible viral illness Observe and return with questions or concerns.   MDM   1. Urticaria    Diagnosis and treatment discussed.  Questions fielded, expectations and recommendations reviewed.  Patient expresses understanding .Will see if hives continue to fade- report back with persistence or other concerns.  Will return to [facility] with questions, concerns or exacerbation.     Rae Halsted, PA-C 08/31/15 1924

## 2015-09-01 ENCOUNTER — Ambulatory Visit
Admission: RE | Admit: 2015-09-01 | Discharge: 2015-09-01 | Disposition: A | Discharge status: Home / Self Care | Payer: Medicaid Other | Source: Ambulatory Visit | Attending: Family Medicine | Admitting: Family Medicine

## 2015-09-01 ENCOUNTER — Telehealth: Payer: Self-pay | Admitting: Physician Assistant

## 2015-09-01 DIAGNOSIS — R1011 Right upper quadrant pain: Secondary | ICD-10-CM | POA: Diagnosis present

## 2015-09-01 NOTE — Telephone Encounter (Signed)
Called patient to check on hives. Steadily improving unless she gets stressed or gets in shower/warm when it turns red. She continues to have occasional  fleeting discomfort in her chest. No cough. No fever.  She is under a lot of stress at home with child Has recently had headaches as well. Have reviewed with her that she had elevated blood pressure at both of our last visits and needs additional evaluation and follow up.  Has not been back in touch with PCP yet, but is having a GB at [facility] ultrasound this AM. Have asked her to have BP checked while at hospital for GB U/S and to drop by PCP office for repeat and to set up consultation. Reminded her that we are available should she have any discomfort even during the weekend daytime hours. She expresses understanding of need to be proactive and follow up.  LWLee PA-C

## 2015-11-12 ENCOUNTER — Encounter (HOSPITAL_BASED_OUTPATIENT_CLINIC_OR_DEPARTMENT_OTHER): Payer: Self-pay

## 2015-11-12 ENCOUNTER — Emergency Department (HOSPITAL_BASED_OUTPATIENT_CLINIC_OR_DEPARTMENT_OTHER)
Admission: EM | Admit: 2015-11-12 | Discharge: 2015-11-12 | Disposition: A | Discharge status: Home / Self Care | Payer: Medicaid Other | Attending: Emergency Medicine | Admitting: Emergency Medicine

## 2015-11-12 ENCOUNTER — Emergency Department (HOSPITAL_BASED_OUTPATIENT_CLINIC_OR_DEPARTMENT_OTHER): Payer: Medicaid Other

## 2015-11-12 DIAGNOSIS — Z79899 Other long term (current) drug therapy: Secondary | ICD-10-CM | POA: Insufficient documentation

## 2015-11-12 DIAGNOSIS — O9989 Other specified diseases and conditions complicating pregnancy, childbirth and the puerperium: Secondary | ICD-10-CM | POA: Insufficient documentation

## 2015-11-12 DIAGNOSIS — O209 Hemorrhage in early pregnancy, unspecified: Secondary | ICD-10-CM | POA: Diagnosis not present

## 2015-11-12 DIAGNOSIS — M542 Cervicalgia: Secondary | ICD-10-CM | POA: Insufficient documentation

## 2015-11-12 DIAGNOSIS — Z87891 Personal history of nicotine dependence: Secondary | ICD-10-CM | POA: Insufficient documentation

## 2015-11-12 DIAGNOSIS — R112 Nausea with vomiting, unspecified: Secondary | ICD-10-CM | POA: Insufficient documentation

## 2015-11-12 DIAGNOSIS — Z792 Long term (current) use of antibiotics: Secondary | ICD-10-CM | POA: Diagnosis not present

## 2015-11-12 DIAGNOSIS — Z91040 Latex allergy status: Secondary | ICD-10-CM | POA: Insufficient documentation

## 2015-11-12 DIAGNOSIS — R1032 Left lower quadrant pain: Secondary | ICD-10-CM | POA: Diagnosis not present

## 2015-11-12 DIAGNOSIS — R52 Pain, unspecified: Secondary | ICD-10-CM

## 2015-11-12 DIAGNOSIS — Z3A01 Less than 8 weeks gestation of pregnancy: Secondary | ICD-10-CM | POA: Diagnosis not present

## 2015-11-12 LAB — CBC WITH DIFFERENTIAL/PLATELET
Basophils Absolute: 0 10*3/uL (ref 0.0–0.1)
Basophils Relative: 0 %
Eosinophils Absolute: 0.1 10*3/uL (ref 0.0–0.7)
Eosinophils Relative: 1 %
HEMATOCRIT: 35 % — AB (ref 36.0–46.0)
Hemoglobin: 12.1 g/dL (ref 12.0–15.0)
LYMPHS ABS: 1.9 10*3/uL (ref 0.7–4.0)
LYMPHS PCT: 30 %
MCH: 30.9 pg (ref 26.0–34.0)
MCHC: 34.6 g/dL (ref 30.0–36.0)
MCV: 89.5 fL (ref 78.0–100.0)
MONO ABS: 0.8 10*3/uL (ref 0.1–1.0)
MONOS PCT: 12 %
NEUTROS ABS: 3.5 10*3/uL (ref 1.7–7.7)
Neutrophils Relative %: 56 %
Platelets: 172 10*3/uL (ref 150–400)
RBC: 3.91 MIL/uL (ref 3.87–5.11)
RDW: 11.9 % (ref 11.5–15.5)
WBC: 6.2 10*3/uL (ref 4.0–10.5)

## 2015-11-12 LAB — URINALYSIS, ROUTINE W REFLEX MICROSCOPIC
Bilirubin Urine: NEGATIVE
GLUCOSE, UA: NEGATIVE mg/dL
HGB URINE DIPSTICK: NEGATIVE
Ketones, ur: NEGATIVE mg/dL
Leukocytes, UA: NEGATIVE
Nitrite: NEGATIVE
PROTEIN: NEGATIVE mg/dL
SPECIFIC GRAVITY, URINE: 1.012 (ref 1.005–1.030)
pH: 6.5 (ref 5.0–8.0)

## 2015-11-12 LAB — COMPREHENSIVE METABOLIC PANEL
ALBUMIN: 4.1 g/dL (ref 3.5–5.0)
ALK PHOS: 43 U/L (ref 38–126)
ALT: 14 U/L (ref 14–54)
ANION GAP: 7 (ref 5–15)
AST: 17 U/L (ref 15–41)
BUN: 12 mg/dL (ref 6–20)
CALCIUM: 8.8 mg/dL — AB (ref 8.9–10.3)
CO2: 26 mmol/L (ref 22–32)
Chloride: 103 mmol/L (ref 101–111)
Creatinine, Ser: 0.69 mg/dL (ref 0.44–1.00)
GFR calc Af Amer: >60 mL/min (ref >60)
GFR calc non Af Amer: >60 mL/min (ref >60)
GLUCOSE: 103 mg/dL — AB (ref 65–99)
Potassium: 3.9 mmol/L (ref 3.5–5.1)
SODIUM: 136 mmol/L (ref 135–145)
Total Bilirubin: 0.4 mg/dL (ref 0.3–1.2)
Total Protein: 6.9 g/dL (ref 6.5–8.1)

## 2015-11-12 LAB — WET PREP, GENITAL
CLUE CELLS WET PREP: NONE SEEN
Sperm: NONE SEEN
Trich, Wet Prep: NONE SEEN
Yeast Wet Prep HPF POC: NONE SEEN

## 2015-11-12 LAB — HCG, QUANTITATIVE, PREGNANCY: HCG, BETA CHAIN, QUANT, S: 7781 m[IU]/mL — AB (ref <5)

## 2015-11-12 LAB — PREGNANCY, URINE: Preg Test, Ur: POSITIVE — AB

## 2015-11-12 NOTE — Discharge Instructions (Signed)
Call your local OB/GYN to establish prenatal care. Return to ED with any new, worsening or concerning symptoms.    Abdominal Pain, Adult Many things can cause abdominal pain. Usually, abdominal pain is not caused by a disease and will improve without treatment. It can often be observed and treated at home. Your health care provider will do a physical exam and possibly order blood tests and X-rays to help determine the seriousness of your pain. However, in many cases, more time must pass before a clear cause of the pain can be found. Before that point, your health care provider may not know if you need more testing or further treatment. HOME CARE INSTRUCTIONS Monitor your abdominal pain for any changes. The following actions may help to alleviate any discomfort you are experiencing:  Only take over-the-counter or prescription medicines as directed by your health care provider.  Do not take laxatives unless directed to do so by your health care provider.  Try a clear liquid diet (broth, tea, or water) as directed by your health care provider. Slowly move to a bland diet as tolerated. SEEK MEDICAL CARE IF:  You have unexplained abdominal pain.  You have abdominal pain associated with nausea or diarrhea.  You have pain when you urinate or have a bowel movement.  You experience abdominal pain that wakes you in the night.  You have abdominal pain that is worsened or improved by eating food.  You have abdominal pain that is worsened with eating fatty foods.  You have a fever. SEEK IMMEDIATE MEDICAL CARE IF:  Your pain does not go away within 2 hours.  You keep throwing up (vomiting).  Your pain is felt only in portions of the abdomen, such as the right side or the left lower portion of the abdomen.  You pass bloody or black tarry stools. MAKE SURE YOU:  Understand these instructions.  Will watch your condition.  Will get help right away if you are not doing well or get worse.     This information is not intended to replace advice given to you by your health care provider. Make sure you discuss any questions you have with your health care provider.   Document Released: 09/18/2005 Document Revised: 08/30/2015 Document Reviewed: 08/18/2013 Elsevier Interactive Patient Education [employer]2016 [employer] Inc[employer]

## 2015-11-12 NOTE — ED Notes (Signed)
Pt reports +preg with counts approx 7 weeks.  Reports sharp pain in left ovary area but not the pain is radiating into her left neck and shoulder. Reports increased discharge but with no odor.  Denies vaginal bleeding.

## 2015-11-12 NOTE — ED Provider Notes (Signed)
CSN: [id number]     Arrival date & time 11/12/15  1848 History   First MD Initiated Contact with Patient 11/12/15 1947     Chief Complaint  Patient presents with  . Abdominal Pain   HPI  Kathy Lynch is a 24 year old female presenting with left lower abdominal pain. She is also approximately [redacted] weeks pregnant. She reports onset of pain within the past few days. She states the pain is intermittent and sharp. She states that it comes on without exacerbating factors. The pain only lasts a few seconds before resolving. She is not currently experiencing the pain. The pain is not associated with any other symptoms including nausea, vomiting, vaginal bleeding, dysuria or hematuria. She does endorse increased vaginal discharge. She states that it is like her normal discharge just increased in volume. Denies odor. She states that the pain feels similar to when she had an ovarian cyst in the past but she is nervous because she is now pregnant. She is also complaining of left neck pain. She states that she woke with it one morning. The neck pain is an aching pain and exacerbated by movement. She has tried heat to the area with moderate relief. She denies radiation of the pain. She denies trauma to the area. She has never had neck pain before. She denies recent MVC's or falls. Denies fevers, chills, headaches, dizziness, syncope, chest pain, shortness of breath, myalgias or rashes.  Past Medical History  Diagnosis Date  . Abnormal Pap smear of cervix    Past Surgical History  Procedure Laterality Date  . Cesarean section    . Cesarean section     No family history on file. Social History  Substance Use Topics  . Smoking status: Former Games developer  . Smokeless tobacco: None  . Alcohol Use: No     Comment: Occasionally   OB History    Gravida Para Term Preterm AB TAB SAB Ectopic Multiple Living   1              Review of Systems  Gastrointestinal: Positive for abdominal pain.  Genitourinary: Positive  for vaginal discharge. Negative for dysuria, hematuria, flank pain and vaginal bleeding.  Musculoskeletal: Positive for neck pain.  All other systems reviewed and are negative.     Allergies  Latex and Sulfa antibiotics  Home Medications   Prior to Admission medications   Medication Sig Start Date End Date Taking? Authorizing Provider  Prenatal Vit-Fe Fumarate-FA (MULTIVITAMIN-PRENATAL) 27-0.8 MG TABS tablet Take 1 tablet by mouth daily at 12 noon.   Yes Historical Provider, MD  albuterol (PROVENTIL HFA;VENTOLIN HFA) 108 (90 BASE) MCG/ACT inhaler Inhale 2 puffs into the lungs every 4 (four) hours as needed for wheezing. 05/15/15   Graylon Good, PA-C  brompheniramine-pseudoephedrine-DM 30-2-10 MG/5ML syrup Take 10 mLs by mouth 4 (four) times daily as needed. 05/15/15   Graylon Good, PA-C  ibuprofen (ADVIL,MOTRIN) 800 MG tablet Take 1 tablet (800 mg total) by mouth every 8 (eight) hours as needed. 06/17/15   Emily Filbert, MD  metoCLOPramide (REGLAN) 5 MG tablet Take 1 tablet (5 mg total) by mouth 3 (three) times daily. 07/26/15   Darien Ramus, MD  Norethin Ace-Eth Estrad-FE (LOESTRIN 24 FE PO) Take 1 tablet by mouth daily.     Historical Provider, MD  oxyCODONE-acetaminophen (ROXICET) 5-325 MG per tablet Take 1 tablet by mouth every 6 (six) hours as needed. 08/03/15   Chinita Pester, FNP  predniSONE (DELTASONE) 10 MG tablet  4 tabs PO QD for 4 days; 3 tabs PO QD for 3 days; 2 tabs PO QD for 2 days; 1 tab PO QD for 1 day 05/15/15   Graylon GoodZachary H Baker, PA-C  Spacer/Aero-Holding Chambers (AEROCHAMBER PLUS FLO-VU LARGE) MISC 1 each by Other route once. 05/15/15   Graylon GoodZachary H Baker, PA-C  sulfamethoxazole-trimethoprim (BACTRIM DS,SEPTRA DS) 800-160 MG per tablet Take 1 tablet by mouth 2 (two) times daily. 08/03/15   Cari B Triplett, FNP   BP 106/63 mmHg  Pulse 80  Temp(Src) 98.8 F (37.1 C) (Oral)  Resp 20  Ht 5\' 3"  (1.6 m)  Wt 108 lb (48.988 kg)  BMI 19.14 kg/m2  SpO2 99%  LMP  09/28/2015 Physical Exam  Constitutional: She appears well-developed and well-nourished. No distress.  Nontoxic-appearing  HENT:  Head: Normocephalic and atraumatic.  Eyes: Conjunctivae are normal. Right eye exhibits no discharge. Left eye exhibits no discharge. No scleral icterus.  Neck: Normal range of motion. Neck supple. Muscular tenderness present. No spinous process tenderness present. Normal range of motion present.    Mildly tender to palpation over left upper trapezius muscle. Patient retains full range of motion of the neck with mild pain with rotation to the left. No obvious deformity. No bony tenderness.  Cardiovascular: Normal rate, regular rhythm and normal heart sounds.   Pulmonary/Chest: Effort normal and breath sounds normal. No respiratory distress. She has no wheezes. She has no rales.  Abdominal: Soft. Bowel sounds are normal. She exhibits no distension. There is no tenderness. There is no rebound and no guarding.  Genitourinary: There is no rash or tenderness on the right labia. There is no rash or tenderness on the left labia. Cervix exhibits discharge (White). Cervix exhibits no motion tenderness and no friability. Right adnexum displays no mass and no tenderness. Left adnexum displays no mass and no tenderness. No erythema or bleeding in the vagina. Vaginal discharge found.  Musculoskeletal: Normal range of motion.  Moves all extremities spontaneously and walks with a steady gait  Neurological: She is alert. Coordination normal.  Skin: Skin is warm and dry. No rash noted.  Psychiatric: She has a normal mood and affect. Her behavior is normal.  Nursing note and vitals reviewed.   ED Course  Procedures (including critical care time) Labs Review Labs Reviewed  WET PREP, GENITAL - Abnormal; Notable for the following:    WBC, Wet Prep HPF POC MANY (*)    All other components within normal limits  PREGNANCY, URINE - Abnormal; Notable for the following:    Preg Test,  Ur POSITIVE (*)    All other components within normal limits  CBC WITH DIFFERENTIAL/PLATELET - Abnormal; Notable for the following:    HCT 35.0 (*)    All other components within normal limits  COMPREHENSIVE METABOLIC PANEL - Abnormal; Notable for the following:    Glucose, Bld 103 (*)    Calcium 8.8 (*)    All other components within normal limits  HCG, QUANTITATIVE, PREGNANCY - Abnormal; Notable for the following:    hCG, Beta Chain, Quant, S 7781 (*)    All other components within normal limits  URINALYSIS, ROUTINE W REFLEX MICROSCOPIC (NOT AT [facility])  GC/CHLAMYDIA PROBE AMP ([place]) NOT AT [facility]    Imaging Review [place] Ob Comp Less 14 Wks  11/12/2015  CLINICAL DATA:  Acute onset of left lower quadrant abdominal pain, radiating to the left shoulder and neck. Initial encounter. EXAM: OBSTETRIC <14 WK US AND TRANSVAGINAL OB US TECHNIQUE: Both  transabdominal and transvaginal ultrasound examinations were performed for complete evaluation of the gestation as well as the maternal uterus, adnexal regions, and pelvic cul-de-sac. Transvaginal technique was performed to assess early pregnancy. COMPARISON:  Pelvic ultrasound performed 07/26/2015 FINDINGS: Intrauterine gestational sac: Visualized/normal in shape. Yolk sac:  Yes Embryo:  No Cardiac Activity: N/A MSD: 10.6  mm   5 w   6  d Maternal uterus/adnexae: No subchorionic hemorrhage is noted. The uterus is otherwise unremarkable. The ovaries are within normal limits. The right ovary measures 3.8 x 1.8 x 1.8 cm, while the left ovary measures 3.3 x 1.9 x 1.8 cm. No suspicious adnexal masses are seen; there is no evidence for ovarian torsion. No free fluid is seen within the pelvic cul-de-sac. IMPRESSION: Single intrauterine gestational sac noted, with a mean sac diameter of 1.1 cm, corresponding to a gestational age of [redacted] weeks 6 days. This matches the gestational age of [redacted] weeks 3 days by LMP, reflecting an estimated date of delivery of July 04, 2016.  Electronically Signed   By: Roanna Raider M.D.   On: 11/12/2015 22:12   US Ob Transvaginal  11/12/2015  CLINICAL DATA:  Acute onset of left lower quadrant abdominal pain, radiating to the left shoulder and neck. Initial encounter. EXAM: OBSTETRIC <14 WK [place] AND TRANSVAGINAL OB US TECHNIQUE: Both transabdominal and transvaginal ultrasound examinations were performed for complete evaluation of the gestation as well as the maternal uterus, adnexal regions, and pelvic cul-de-sac. Transvaginal technique was performed to assess early pregnancy. COMPARISON:  Pelvic ultrasound performed 07/26/2015 FINDINGS: Intrauterine gestational sac: Visualized/normal in shape. Yolk sac:  Yes Embryo:  No Cardiac Activity: N/A MSD: 10.6  mm   5 w   6  d Maternal uterus/adnexae: No subchorionic hemorrhage is noted. The uterus is otherwise unremarkable. The ovaries are within normal limits. The right ovary measures 3.8 x 1.8 x 1.8 cm, while the left ovary measures 3.3 x 1.9 x 1.8 cm. No suspicious adnexal masses are seen; there is no evidence for ovarian torsion. No free fluid is seen within the pelvic cul-de-sac. IMPRESSION: Single intrauterine gestational sac noted, with a mean sac diameter of 1.1 cm, corresponding to a gestational age of [redacted] weeks 6 days. This matches the gestational age of [redacted] weeks 3 days by LMP, reflecting an estimated date of delivery of July 04, 2016. Electronically Signed   By: Roanna Raider M.D.   On: 11/12/2015 22:12   I have personally reviewed and evaluated these images and lab results as part of my medical decision-making.   EKG Interpretation None      MDM   Final diagnoses:  Left lower quadrant pain   Patient presenting with intermittent, sharp left lower quadrant pain. Patient is approximately [redacted] weeks pregnant. Associated symptoms include increased vaginal discharge. No vaginal bleeding. She is not currently experiencing the pain. States that it is similar to her ovarian cyst pain. She is  also complaining of neck tenderness. No injury to the neck. VSS. Patient is nontoxic, nonseptic appearing, in no apparent distress. Abdomen is soft, nontender. No peritoneal signs. White discharge from the cervix noted on pelvic exam. No CMT or friability. Left lateral neck tender to palpation. Full range of motion intact. Lab work is unremarkable. Wet prep positive for many white blood cells. Patient states she has no concern for STD exposure and declines prophylactic treatment. Pelvic ultrasound with a single IUP. No ectopic pregnancy. Patient discharged home with conservative symptomatic treatment and given strict instructions for  follow-up with their OB/GYN.  I have also discussed reasons to return immediately to the ER.  Patient expresses understanding and agrees with plan.      Alveta Heimlich, PA-C 11/13/15 0023  Jerelyn Scott, MD 11/17/15 [phone]

## 2015-11-13 LAB — GC/CHLAMYDIA PROBE AMP (~~LOC~~) NOT AT ARMC
CHLAMYDIA, DNA PROBE: NEGATIVE
Neisseria Gonorrhea: NEGATIVE

## 2015-11-18 ENCOUNTER — Ambulatory Visit
Admission: EM | Admit: 2015-11-18 | Discharge: 2015-11-18 | Disposition: A | Discharge status: Home / Self Care | Payer: Medicaid Other | Attending: Family Medicine | Admitting: Family Medicine

## 2015-11-18 ENCOUNTER — Encounter: Payer: Self-pay | Admitting: Gynecology

## 2015-11-18 DIAGNOSIS — R21 Rash and other nonspecific skin eruption: Secondary | ICD-10-CM | POA: Diagnosis not present

## 2015-11-18 DIAGNOSIS — L659 Nonscarring hair loss, unspecified: Secondary | ICD-10-CM | POA: Diagnosis present

## 2015-11-18 DIAGNOSIS — Z3A01 Less than 8 weeks gestation of pregnancy: Secondary | ICD-10-CM | POA: Diagnosis not present

## 2015-11-18 DIAGNOSIS — O2691 Pregnancy related conditions, unspecified, first trimester: Secondary | ICD-10-CM

## 2015-11-18 LAB — SEDIMENTATION RATE: Sed Rate: 7 mm/hr (ref 0–20)

## 2015-11-18 LAB — HCG, QUANTITATIVE, PREGNANCY: hCG, Beta Chain, Quant, S: 14014 m[IU]/mL — ABNORMAL HIGH (ref <5)

## 2015-11-18 NOTE — ED Notes (Signed)
Patient stated flushed cheek / hair loss / aunt with lupus and would like to be tested.

## 2015-11-18 NOTE — Discharge Instructions (Signed)
Rash °A rash is a change in the color or feel of your skin. There are many different types of rashes. You may have other problems along with your rash. °HOME CARE °· Avoid the thing that caused your rash. °· Do not scratch your rash. °· You may take cools baths to help stop itching. °· Only take medicines as told by your doctor. °· Keep all doctor visits as told. °GET HELP RIGHT AWAY IF:  °· Your pain, puffiness (swelling), or redness gets worse. °· You have a fever. °· You have new or severe problems. °· You have body aches, watery poop (diarrhea), or you throw up (vomit). °· Your rash is not better after 3 days. °MAKE SURE YOU:  °· Understand these instructions. °· Will watch your condition. °· Will get help right away if you are not doing well or get worse. °  °This information is not intended to replace advice given to you by your health care provider. Make sure you discuss any questions you have with your health care provider. °  °Document Released: 05/27/2008 Document Revised: 03/02/2012 Document Reviewed: 04/26/2015 °Elsevier Interactive Patient Education ©2016 [employer] ° °

## 2015-11-18 NOTE — ED Provider Notes (Signed)
CSN: [id number]     Arrival date & time 11/18/15  1221 History   First MD Initiated Contact with Patient 11/18/15 1548    Nurses notes were reviewed. Chief Complaint  Patient presents with  . Alopecia   Patient is here because several concerns. #1 she's had states that she is losing hair she has rash on face and she reports recurrent urticaria since having allergic reaction to Septra in August. She states that she feels the loss of hair rash on face and general malaise that she has a consistent with SLE worse or systemic lupus condition. She states her aunt has lupus has had similar symptoms and she is googling of course feels the best the nail on diagnosis for her. To urticaria as lupus not necessarily tied together and there may be other things causing her problems and medical issues. She also reports being about [redacted] weeks pregnant she was seen in the ER a few weeks ago because of left lower abdominal pain documented had an ectopic. She was assured that the baby is growing in the uterus and that ectopic pregnancy is not excluded duration. She requests a serum hCG as well because she is worried that she doesn't feel as pregnant she did beginning of her pregnancy and she is worried she may have miscarriage or thought to miscarriage. She is a gravida 4 para 1 AB 1 and miscarriage 1   (Consider location/radiation/quality/duration/timing/severity/associated sxs/prior Treatment) HPI  Past Medical History  Diagnosis Date  . Abnormal Pap smear of cervix    Past Surgical History  Procedure Laterality Date  . Cesarean section    . Cesarean section     No family history on file. Social History  Substance Use Topics  . Smoking status: Former Games developermoker  . Smokeless tobacco: None  . Alcohol Use: No     Comment: Occasionally   OB History    Gravida Para Term Preterm AB TAB SAB Ectopic Multiple Living   1              Review of Systems  Constitutional: Positive for activity change and fatigue.   Skin: Negative for rash.  Neurological: Positive for weakness.  All other systems reviewed and are negative.   Allergies  Latex and Sulfa antibiotics  Home Medications   Prior to Admission medications   Medication Sig Start Date End Date Taking? Authorizing Provider  Prenatal Vit-Fe Fumarate-FA (PRENATAL MULTIVITAMIN) TABS tablet Take 1 tablet by mouth daily at 12 noon.   Yes Historical Provider, MD  albuterol (PROVENTIL HFA;VENTOLIN HFA) 108 (90 BASE) MCG/ACT inhaler Inhale 2 puffs into the lungs every 4 (four) hours as needed for wheezing. 05/15/15   Graylon GoodZachary H Baker, PA-C  brompheniramine-pseudoephedrine-DM 30-2-10 MG/5ML syrup Take 10 mLs by mouth 4 (four) times daily as needed. 05/15/15   Graylon GoodZachary H Baker, PA-C  ibuprofen (ADVIL,MOTRIN) 800 MG tablet Take 1 tablet (800 mg total) by mouth every 8 (eight) hours as needed. 06/17/15   Emily FilbertJonathan E Williams, MD  metoCLOPramide (REGLAN) 5 MG tablet Take 1 tablet (5 mg total) by mouth 3 (three) times daily. 07/26/15   Darien Ramusavid W Kaminski, MD  Norethin Ace-Eth Estrad-FE (LOESTRIN 24 FE PO) Take 1 tablet by mouth daily.     Historical Provider, MD  oxyCODONE-acetaminophen (ROXICET) 5-325 MG per tablet Take 1 tablet by mouth every 6 (six) hours as needed. 08/03/15   Chinita Pesterari B Triplett, FNP  predniSONE (DELTASONE) 10 MG tablet 4 tabs PO QD for 4 days; 3 tabs  PO QD for 3 days; 2 tabs PO QD for 2 days; 1 tab PO QD for 1 day 05/15/15   Graylon Good, PA-C  Prenatal Vit-Fe Fumarate-FA (MULTIVITAMIN-PRENATAL) 27-0.8 MG TABS tablet Take 1 tablet by mouth daily at 12 noon.    Historical Provider, MD  Spacer/Aero-Holding Chambers (AEROCHAMBER PLUS FLO-VU LARGE) MISC 1 each by Other route once. 05/15/15   Graylon Good, PA-C  sulfamethoxazole-trimethoprim (BACTRIM DS,SEPTRA DS) 800-160 MG per tablet Take 1 tablet by mouth 2 (two) times daily. 08/03/15   Chinita Pester, FNP   Meds Ordered and Administered this Visit  Medications - No data to display  BP 103/68 mmHg   Pulse 87  Temp(Src) 98.1 F (36.7 C) (Oral)  Resp 16  Ht  (1.6 m)  Wt 108 lb (48.988 kg)  BMI 19.14 kg/m2  SpO2 100%  LMP 09/28/2015 No data found.   Physical Exam  Constitutional: She is oriented to person, place, and time. She appears well-developed and well-nourished.  HENT:  Head: Normocephalic and atraumatic.  Eyes: Conjunctivae are normal. Pupils are equal, round, and reactive to light.  Cardiovascular: Normal rate and regular rhythm.   Pulmonary/Chest: Effort normal and breath sounds normal.  Musculoskeletal: Normal range of motion.  Neurological: She is alert and oriented to person, place, and time.  Skin: Rash noted.  She has a very pale rash on the outer face underneath her eyes.  Psychiatric: She has a normal mood and affect.  Vitals reviewed.   ED Course  Procedures (including critical care time)  Labs Review Labs Reviewed  SEDIMENTATION RATE  ANTINUCLEAR ANTIBODIES, IFA  RHEUMATOID FACTOR  HCG, QUANTITATIVE, PREGNANCY    Imaging Review No results found.   Visual Acuity Review  Right Eye Distance:   Left Eye Distance:   Bilateral Distance:    Right Eye Near:   Left Eye Near:    Bilateral Near:     Results for orders placed or performed during the hospital encounter of 11/18/15  Sedimentation rate  Result Value Ref Range   Sed Rate 7 0 - 20 mm/hr  hCG, quantitative, pregnancy  Result Value Ref Range   hCG, Beta Chain, Quant, S 14014 (H) <5 mIU/mL     MDM  No diagnosis found. Sedimentation rate was negative rheumatoid factor and ANA pending. HCG is consistent with someone going from the  fifth week pregnancy to the 6 week pregnancy.  She may call back on Monday to get the results of her ANA rheumatoid factor. Follow-up for PCP or OB/GYN.    Hassan Rowan, MD 11/18/15 [phone]

## 2015-11-19 ENCOUNTER — Emergency Department (HOSPITAL_BASED_OUTPATIENT_CLINIC_OR_DEPARTMENT_OTHER): Payer: Medicaid Other

## 2015-11-19 ENCOUNTER — Encounter (HOSPITAL_BASED_OUTPATIENT_CLINIC_OR_DEPARTMENT_OTHER): Payer: Self-pay | Admitting: *Deleted

## 2015-11-19 ENCOUNTER — Emergency Department (HOSPITAL_BASED_OUTPATIENT_CLINIC_OR_DEPARTMENT_OTHER)
Admission: EM | Admit: 2015-11-19 | Discharge: 2015-11-19 | Disposition: A | Discharge status: Home / Self Care | Payer: Medicaid Other | Attending: Emergency Medicine | Admitting: Emergency Medicine

## 2015-11-19 DIAGNOSIS — O418X11 Other specified disorders of amniotic fluid and membranes, first trimester, fetus 1: Secondary | ICD-10-CM | POA: Insufficient documentation

## 2015-11-19 DIAGNOSIS — O468X1 Other antepartum hemorrhage, first trimester: Secondary | ICD-10-CM

## 2015-11-19 DIAGNOSIS — Z91040 Latex allergy status: Secondary | ICD-10-CM | POA: Insufficient documentation

## 2015-11-19 DIAGNOSIS — Z79899 Other long term (current) drug therapy: Secondary | ICD-10-CM | POA: Diagnosis not present

## 2015-11-19 DIAGNOSIS — Z87891 Personal history of nicotine dependence: Secondary | ICD-10-CM | POA: Diagnosis not present

## 2015-11-19 DIAGNOSIS — Z9889 Other specified postprocedural states: Secondary | ICD-10-CM | POA: Insufficient documentation

## 2015-11-19 DIAGNOSIS — Z79818 Long term (current) use of other agents affecting estrogen receptors and estrogen levels: Secondary | ICD-10-CM | POA: Diagnosis not present

## 2015-11-19 DIAGNOSIS — Z7952 Long term (current) use of systemic steroids: Secondary | ICD-10-CM | POA: Diagnosis not present

## 2015-11-19 DIAGNOSIS — N939 Abnormal uterine and vaginal bleeding, unspecified: Secondary | ICD-10-CM

## 2015-11-19 DIAGNOSIS — O209 Hemorrhage in early pregnancy, unspecified: Secondary | ICD-10-CM | POA: Diagnosis present

## 2015-11-19 DIAGNOSIS — Z3A01 Less than 8 weeks gestation of pregnancy: Secondary | ICD-10-CM | POA: Diagnosis not present

## 2015-11-19 DIAGNOSIS — O418X10 Other specified disorders of amniotic fluid and membranes, first trimester, not applicable or unspecified: Secondary | ICD-10-CM

## 2015-11-19 LAB — CBC WITH DIFFERENTIAL/PLATELET
BASOS ABS: 0 10*3/uL (ref 0.0–0.1)
BASOS PCT: 0 %
EOS ABS: 0.1 10*3/uL (ref 0.0–0.7)
Eosinophils Relative: 1 %
HCT: 36.6 % (ref 36.0–46.0)
Hemoglobin: 12.7 g/dL (ref 12.0–15.0)
Lymphocytes Relative: 32 %
Lymphs Abs: 1.8 10*3/uL (ref 0.7–4.0)
MCH: 31.3 pg (ref 26.0–34.0)
MCHC: 34.7 g/dL (ref 30.0–36.0)
MCV: 90.1 fL (ref 78.0–100.0)
MONO ABS: 0.6 10*3/uL (ref 0.1–1.0)
MONOS PCT: 11 %
NEUTROS PCT: 56 %
Neutro Abs: 3 10*3/uL (ref 1.7–7.7)
Platelets: 196 10*3/uL (ref 150–400)
RBC: 4.06 MIL/uL (ref 3.87–5.11)
RDW: 11.8 % (ref 11.5–15.5)
WBC: 5.4 10*3/uL (ref 4.0–10.5)

## 2015-11-19 LAB — WET PREP, GENITAL
Sperm: NONE SEEN
TRICH WET PREP: NONE SEEN

## 2015-11-19 LAB — BASIC METABOLIC PANEL
Anion gap: 7 (ref 5–15)
BUN: 8 mg/dL (ref 6–20)
CALCIUM: 9.2 mg/dL (ref 8.9–10.3)
CO2: 28 mmol/L (ref 22–32)
CREATININE: 0.52 mg/dL (ref 0.44–1.00)
Chloride: 102 mmol/L (ref 101–111)
Glucose, Bld: 117 mg/dL — ABNORMAL HIGH (ref 65–99)
Potassium: 3.9 mmol/L (ref 3.5–5.1)
SODIUM: 137 mmol/L (ref 135–145)

## 2015-11-19 LAB — URINALYSIS, ROUTINE W REFLEX MICROSCOPIC
BILIRUBIN URINE: NEGATIVE
Glucose, UA: NEGATIVE mg/dL
HGB URINE DIPSTICK: NEGATIVE
Ketones, ur: NEGATIVE mg/dL
LEUKOCYTES UA: NEGATIVE
NITRITE: NEGATIVE
PROTEIN: NEGATIVE mg/dL
Specific Gravity, Urine: 1.003 — ABNORMAL LOW (ref 1.005–1.030)
pH: 7 (ref 5.0–8.0)

## 2015-11-19 LAB — PREGNANCY, URINE: PREG TEST UR: POSITIVE — AB

## 2015-11-19 LAB — HCG, QUANTITATIVE, PREGNANCY: HCG, BETA CHAIN, QUANT, S: 10519 m[IU]/mL — AB (ref <5)

## 2015-11-19 NOTE — Discharge Instructions (Signed)
Please read and follow all provided instructions.  Your diagnoses today include:  1. Subchorionic bleed, first trimester   2. Vaginal bleeding     Tests performed today include:  Blood counts and electrolytes  Urine test to look for infection and pregnancy (in women)  Wet prep - shows yeast infection  Ultrasound - shows growing intrauterine pregnancy with a small subchorionic hemorrhage, shows heart beat  Vital signs. See below for your results today.   Medications prescribed:   None Take any prescribed medications only as directed.  Home care instructions:   Follow any educational materials contained in this packet.  Follow-up instructions: Please follow-up with your OB/GYN in the next 7 days for further evaluation of your symptoms.    Return instructions:  SEEK IMMEDIATE MEDICAL ATTENTION IF:  You start having heavier vaginal bleeding  The pain does not go away or becomes severe   A temperature above 101F develops   Repeated vomiting occurs (multiple episodes)   Blood is being passed in stools or vomit (bright red or black tarry stools)   You develop chest pain, difficulty breathing, dizziness or fainting, or become confused, poorly responsive, or inconsolable (young children)  If you have any other emergent concerns regarding your health  Your vital signs today were: BP 110/76 mmHg   Pulse 84   Temp(Src) 99 F (37.2 C) (Oral)   Resp 20   SpO2 98%   LMP 09/28/2015 If your blood pressure (bp) was elevated above 135/85 this visit, please have this repeated by your doctor within one month. --------------

## 2015-11-19 NOTE — ED Provider Notes (Signed)
CSN: [id number]     Arrival date & time 11/19/15  1515 History   First MD Initiated Contact with Patient 11/19/15 1624     Chief Complaint  Patient presents with  . Vaginal Bleeding     (Consider location/radiation/quality/duration/timing/severity/associated sxs/prior Treatment) HPI Comments: Patient who is currently pregnant, approximately [redacted] weeks along, having light brown discharge since yesterday with temporary increase in bright red blood this morning. Also describes mild lower abdominal cramping. No lightheadedness, syncope. No vomiting, diarrhea, urinary symptoms. Patient was seen at urgent care yesterday and had quant drawn. She had confirmed interuterine pregnancy 1 week ago. She is G4 P1. One elective and 1 spontaneous abortion in the past.  Patient is a 24 y.o. female presenting with vaginal bleeding. The history is provided by the patient.  Vaginal Bleeding Associated symptoms: no abdominal pain, no dysuria, no fever and no nausea     Past Medical History  Diagnosis Date  . Abnormal Pap smear of cervix    Past Surgical History  Procedure Laterality Date  . Cesarean section    . Cesarean section     No family history on file. Social History  Substance Use Topics  . Smoking status: Former Games developer  . Smokeless tobacco: None  . Alcohol Use: No     Comment: Occasionally   OB History    Gravida Para Term Preterm AB TAB SAB Ectopic Multiple Living   1              Review of Systems  Constitutional: Negative for fever.  HENT: Negative for rhinorrhea and sore throat.   Eyes: Negative for redness.  Respiratory: Negative for cough.   Cardiovascular: Negative for chest pain.  Gastrointestinal: Negative for nausea, vomiting, abdominal pain and diarrhea.  Genitourinary: Positive for vaginal bleeding and pelvic pain. Negative for dysuria.  Musculoskeletal: Negative for myalgias.  Skin: Negative for rash.  Neurological: Negative for headaches.    Allergies  Latex and  Sulfa antibiotics  Home Medications   Prior to Admission medications   Medication Sig Start Date End Date Taking? Authorizing Provider  albuterol (PROVENTIL HFA;VENTOLIN HFA) 108 (90 BASE) MCG/ACT inhaler Inhale 2 puffs into the lungs every 4 (four) hours as needed for wheezing. 05/15/15   Graylon Good, PA-C  brompheniramine-pseudoephedrine-DM 30-2-10 MG/5ML syrup Take 10 mLs by mouth 4 (four) times daily as needed. 05/15/15   Graylon Good, PA-C  ibuprofen (ADVIL,MOTRIN) 800 MG tablet Take 1 tablet (800 mg total) by mouth every 8 (eight) hours as needed. 06/17/15   Emily Filbert, MD  metoCLOPramide (REGLAN) 5 MG tablet Take 1 tablet (5 mg total) by mouth 3 (three) times daily. 07/26/15   Darien Ramus, MD  Norethin Ace-Eth Estrad-FE (LOESTRIN 24 FE PO) Take 1 tablet by mouth daily.     Historical Provider, MD  oxyCODONE-acetaminophen (ROXICET) 5-325 MG per tablet Take 1 tablet by mouth every 6 (six) hours as needed. 08/03/15   Chinita Pester, FNP  predniSONE (DELTASONE) 10 MG tablet 4 tabs PO QD for 4 days; 3 tabs PO QD for 3 days; 2 tabs PO QD for 2 days; 1 tab PO QD for 1 day 05/15/15   Graylon Good, PA-C  Prenatal Vit-Fe Fumarate-FA (MULTIVITAMIN-PRENATAL) 27-0.8 MG TABS tablet Take 1 tablet by mouth daily at 12 noon.    Historical Provider, MD  Prenatal Vit-Fe Fumarate-FA (PRENATAL MULTIVITAMIN) TABS tablet Take 1 tablet by mouth daily at 12 noon.    Historical Provider, MD  Spacer/Aero-Holding Chambers (AEROCHAMBER PLUS FLO-VU LARGE) MISC 1 each by Other route once. 05/15/15   Graylon Good, PA-C  sulfamethoxazole-trimethoprim (BACTRIM DS,SEPTRA DS) 800-160 MG per tablet Take 1 tablet by mouth 2 (two) times daily. 08/03/15   Cari B Triplett, FNP   BP 121/80 mmHg  Pulse 90  Temp(Src) 98.9 F (37.2 C) (Oral)  Resp 18  SpO2 100%  LMP 09/28/2015 Physical Exam  Constitutional: She appears well-developed and well-nourished.  HENT:  Head: Normocephalic and atraumatic.  Eyes:  Conjunctivae are normal. Right eye exhibits no discharge. Left eye exhibits no discharge.  Neck: Normal range of motion. Neck supple.  Cardiovascular: Normal rate, regular rhythm and normal heart sounds.   Pulmonary/Chest: Effort normal and breath sounds normal.  Abdominal: Soft. There is no tenderness.  Genitourinary: There is no rash or tenderness on the right labia. There is no rash or tenderness on the left labia. Uterus is not tender. Cervix exhibits discharge (brown). Cervix exhibits no motion tenderness and no friability. Right adnexum displays no mass and no tenderness. Left adnexum displays no mass and no tenderness. No signs of injury around the vagina. Vaginal discharge (thick, brown) found.  Cervical os is closed.  Neurological: She is alert.  Skin: Skin is warm and dry.  Psychiatric: She has a normal mood and affect.  Nursing note and vitals reviewed.   ED Course  Procedures (including critical care time) Labs Review Labs Reviewed  WET PREP, GENITAL - Abnormal; Notable for the following:    Yeast Wet Prep HPF POC PRESENT (*)    Clue Cells Wet Prep HPF POC PRESENT (*)    WBC, Wet Prep HPF POC MANY (*)    All other components within normal limits  URINALYSIS, ROUTINE W REFLEX MICROSCOPIC (NOT AT [facility]) - Abnormal; Notable for the following:    Specific Gravity, Urine 1.003 (*)    All other components within normal limits  PREGNANCY, URINE - Abnormal; Notable for the following:    Preg Test, Ur POSITIVE (*)    All other components within normal limits  HCG, QUANTITATIVE, PREGNANCY - Abnormal; Notable for the following:    hCG, Beta Chain, Quant, S 10519 (*)    All other components within normal limits  BASIC METABOLIC PANEL - Abnormal; Notable for the following:    Glucose, Bld 117 (*)    All other components within normal limits  CBC WITH DIFFERENTIAL/PLATELET  GC/CHLAMYDIA PROBE AMP ([place]) NOT AT [facility]    Imaging Review US Ob Comp Less 14 Wks  11/19/2015   CLINICAL DATA:  First-trimester pregnancy. Vaginal bleeding. Episode of bright red blood. EXAM: OBSTETRIC <14 WK [place] AND TRANSVAGINAL OB US TECHNIQUE: Both transabdominal and transvaginal ultrasound examinations were performed for complete evaluation of the gestation as well as the maternal uterus, adnexal regions, and pelvic cul-de-sac. Transvaginal technique was performed to assess early pregnancy. COMPARISON:  Vaginal ultrasound 11/12/2015. FINDINGS: Intrauterine gestational sac: The gestational sac is somewhat ovoid in shape. Yolk sac:  Present Embryo:  Present Cardiac Activity: Present Heart Rate: 82  bpm MSD:   mm    w     d CRL:  5.7  mm   6 w   3 d                  [place] EDC: 07/11/2016 Maternal uterus/adnexae: A small subchorionic hemorrhage is present. The ovaries are of normal size and echotexture bilaterally. No significant free fluid is present. IMPRESSION: 1. Single intrauterine gestation with  interval growth since last week as detailed above. 2. Small subchorionic hemorrhage. The lower uterine segment is closed. There is no free fluid. Electronically Signed   By: Marin Robertshristopher  Mattern M.D.   On: 11/19/2015 17:43   [place] Ob Transvaginal  11/19/2015  CLINICAL DATA:  First-trimester pregnancy. Vaginal bleeding. Episode of bright red blood. EXAM: OBSTETRIC <14 WK US AND TRANSVAGINAL OB US TECHNIQUE: Both transabdominal and transvaginal ultrasound examinations were performed for complete evaluation of the gestation as well as the maternal uterus, adnexal regions, and pelvic cul-de-sac. Transvaginal technique was performed to assess early pregnancy. COMPARISON:  Vaginal ultrasound 11/12/2015. FINDINGS: Intrauterine gestational sac: The gestational sac is somewhat ovoid in shape. Yolk sac:  Present Embryo:  Present Cardiac Activity: Present Heart Rate: 82  bpm MSD:   mm    w     d CRL:  5.7  mm   6 w   3 d                  US EDC: 07/11/2016 Maternal uterus/adnexae: A small subchorionic hemorrhage is present.  The ovaries are of normal size and echotexture bilaterally. No significant free fluid is present. IMPRESSION: 1. Single intrauterine gestation with interval growth since last week as detailed above. 2. Small subchorionic hemorrhage. The lower uterine segment is closed. There is no free fluid. Electronically Signed   By: Marin Robertshristopher  Mattern M.D.   On: 11/19/2015 17:43   I have personally reviewed and evaluated these images and lab results as part of my medical decision-making.   EKG Interpretation None       4:47 PM Patient seen and examined. Work-up initiated.   Vital signs reviewed and are as follows: BP 121/80 mmHg  Pulse 90  Temp(Src) 98.9 F (37.2 C) (Oral)  Resp 18  SpO2 100%  LMP 09/28/2015  Attempted to evaluate uterus with bedside ultrasound but did not have adequate window to evaluate uterus. Formal US ordered.   7:21 PM pelvic exam performed with nurse chaperone Benna Dunks(Taryn RN). Patient updated on results of ultrasound and of small subchorionic hemorrhage. Remainder of lab work is reassuring. Wet prep does suggest yeast infection, however patient has no bothersome symptoms so will not treat this at this time. Quantitative hCG drawn today is slightly low compared to outside urgent care performed yesterday. Unsure how to interpret this given reassuring ultrasound. Patient has OB/GYN appointment in the next week.  She is encouraged to return to the emergency department with worsening vaginal bleeding, lower abdominal cramping, changing symptoms or other concerns.   MDM   Final diagnoses:  Subchorionic bleed, first trimester   Patient with mild lower abdominal cramping in first trimester pregnancy, brown discharge and possible bleeding. Patient has a small subchorionic hemorrhage on ultrasound. Her symptoms are currently mild and improved. Lab findings as above. Patient to follow-up with GYN in the upcoming week for recheck and further management. Return instructions as  above.   Renne CriglerJoshua Trayvion Embleton, PA-C 11/19/15 1924  Rolan BuccoMelanie Belfi, MD 11/19/15 [phone]

## 2015-11-19 NOTE — ED Notes (Signed)
States she is pregnant (states approx 6 weeks) started having light brown like blood, but now has noted amt has increased and is bright red, having abd cramping as well. G4, P1

## 2015-11-20 LAB — GC/CHLAMYDIA PROBE AMP (~~LOC~~) NOT AT ARMC
CHLAMYDIA, DNA PROBE: NEGATIVE
NEISSERIA GONORRHEA: NEGATIVE

## 2015-11-21 LAB — RHEUMATOID FACTOR: Rhuematoid fact SerPl-aCnc: <10 IU/mL (ref 0.0–13.9)

## 2015-11-24 LAB — ANTINUCLEAR ANTIBODIES, IFA: ANTINUCLEAR ANTIBODIES, IFA: NEGATIVE

## 2015-12-24 HISTORY — PX: WISDOM TOOTH EXTRACTION: SHX21

## 2017-01-23 IMAGING — US US TRANSVAGINAL NON-OB
1 series · 14 of 25 positions shown · non-contrast
Comparison: None

CLINICAL DATA: Right-sided pelvic pain since last night

EXAM:
TRANSABDOMINAL AND TRANSVAGINAL ULTRASOUND OF PELVIS
TECHNIQUE: Both transabdominal and transvaginal ultrasound examinations of the
pelvis were performed. Transabdominal technique was performed for
global imaging of the pelvis including uterus, ovaries, adnexal
regions, and pelvic cul-de-sac. It was necessary to proceed with
endovaginal exam following the transabdominal exam to visualize the
endometrium and ovaries.

[Series 1: us transvaginal non-ob · 0.17mm/px · 14 of 100 slices shown]
[im 1/100]
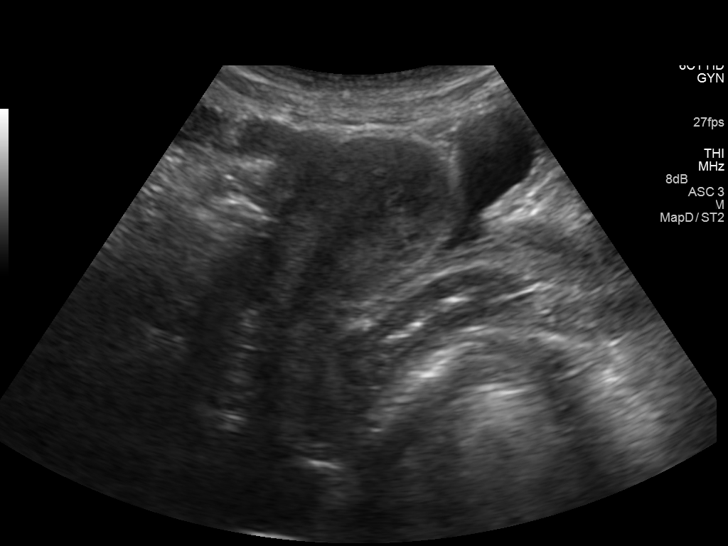
[im 9/100]
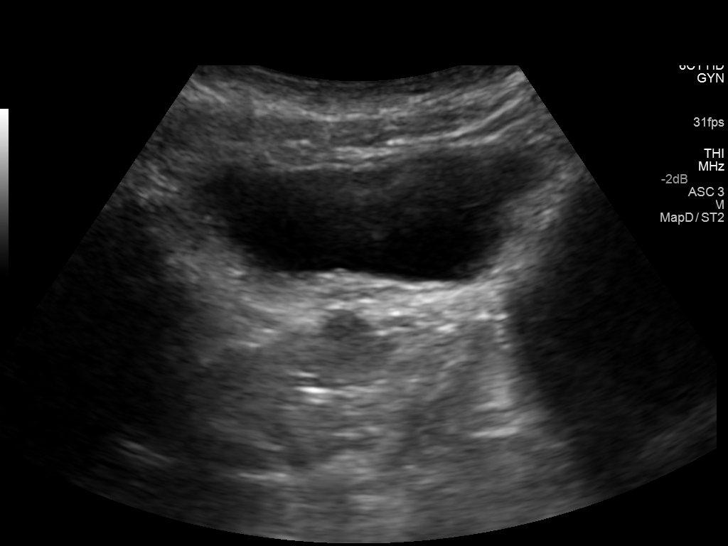
[im 17/100]
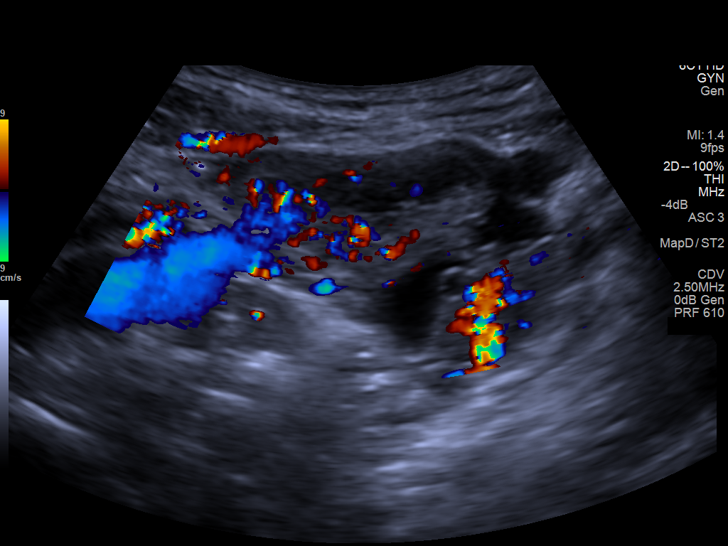
[im 25/100]
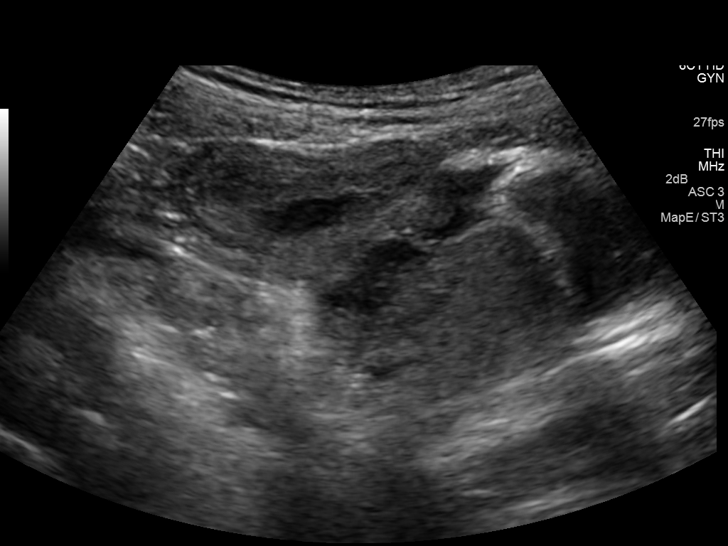
[im 34/100]
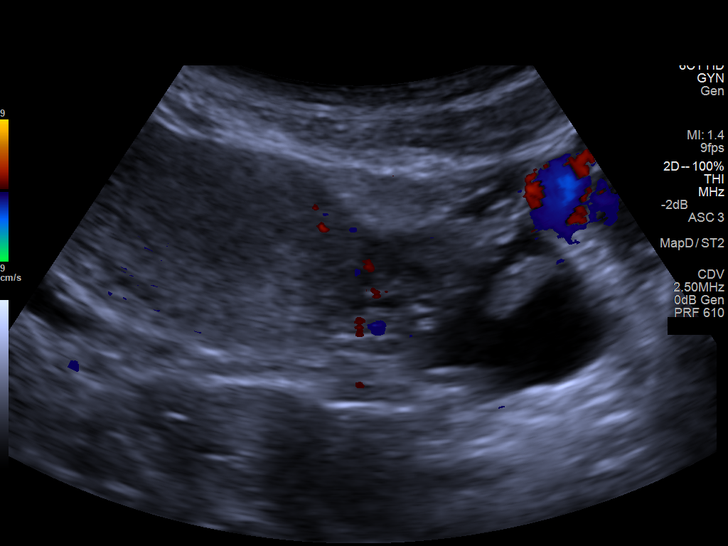
[im 38/100]
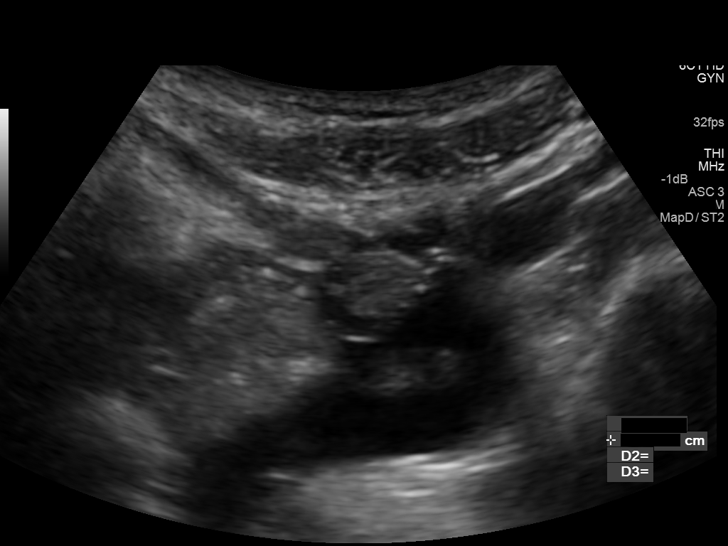
[im 46/100]
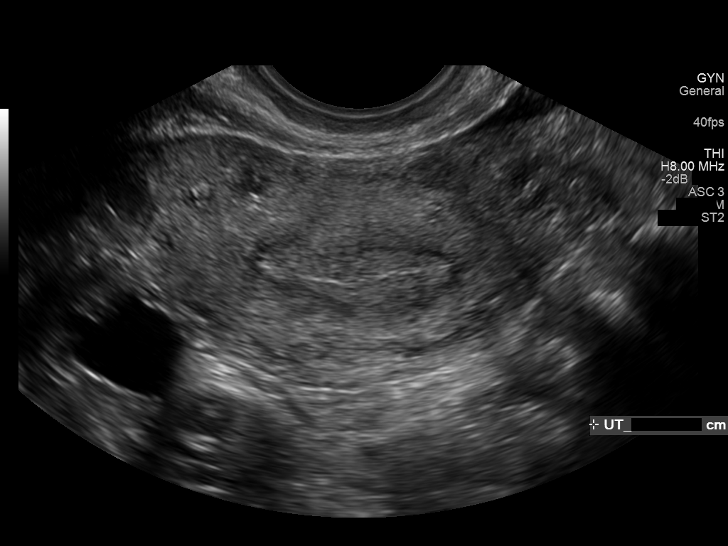
[im 54/100]
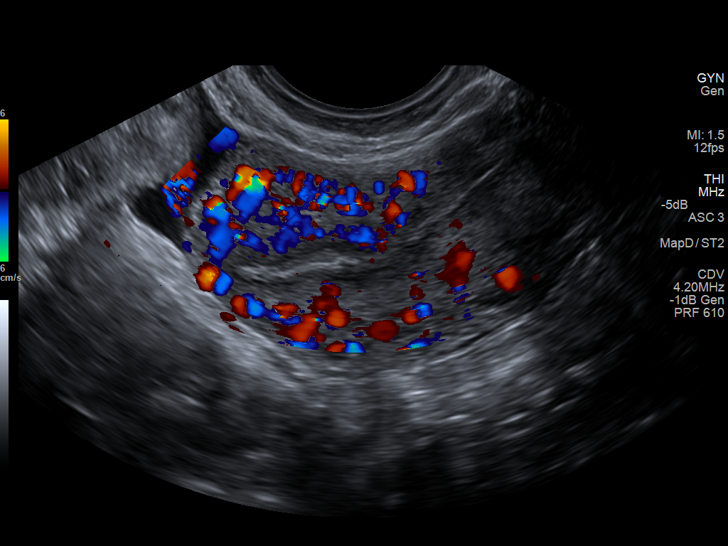
[im 62/100]
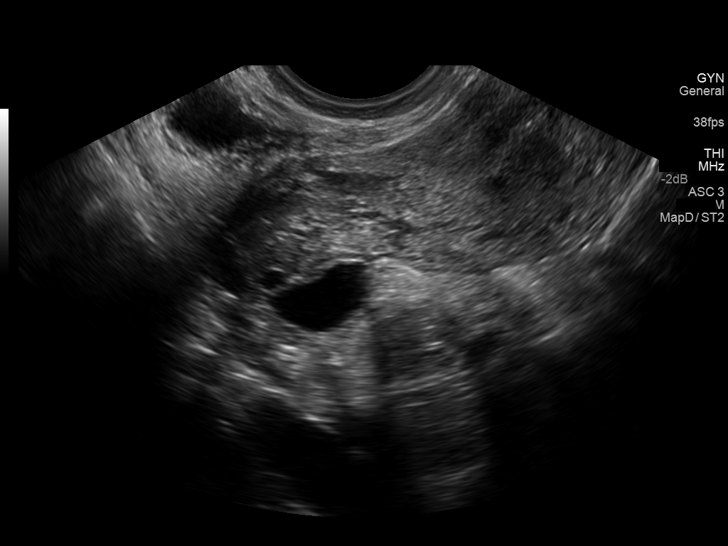
[im 67/100]
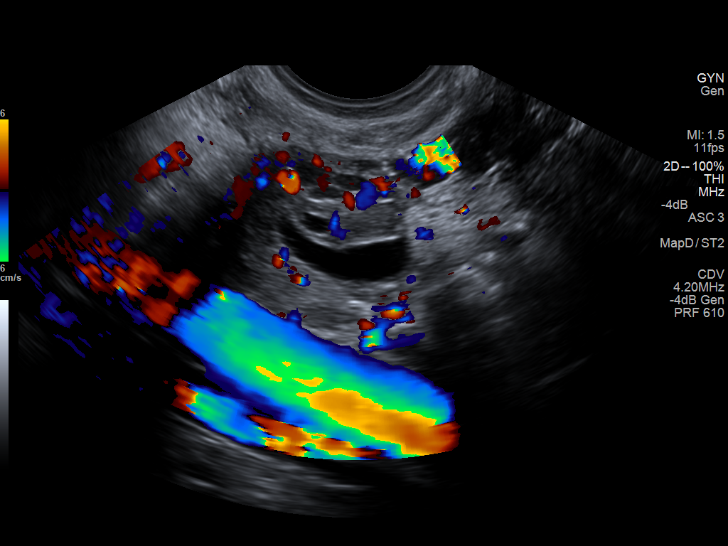
[im 75/100]
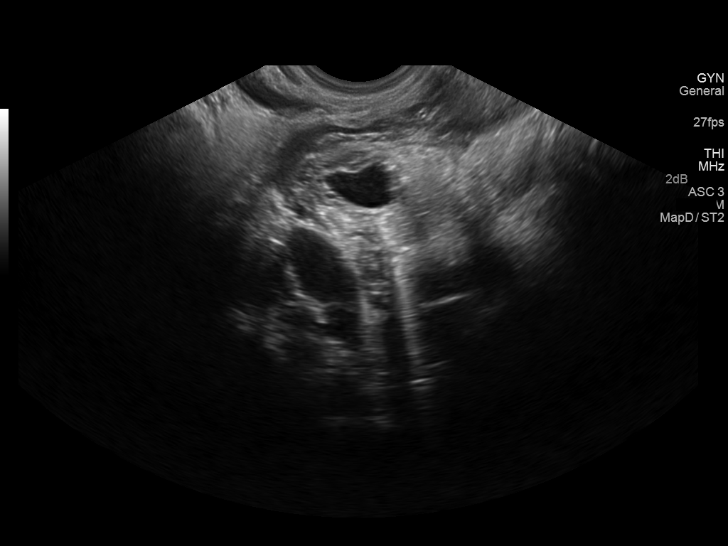
[im 83/100]
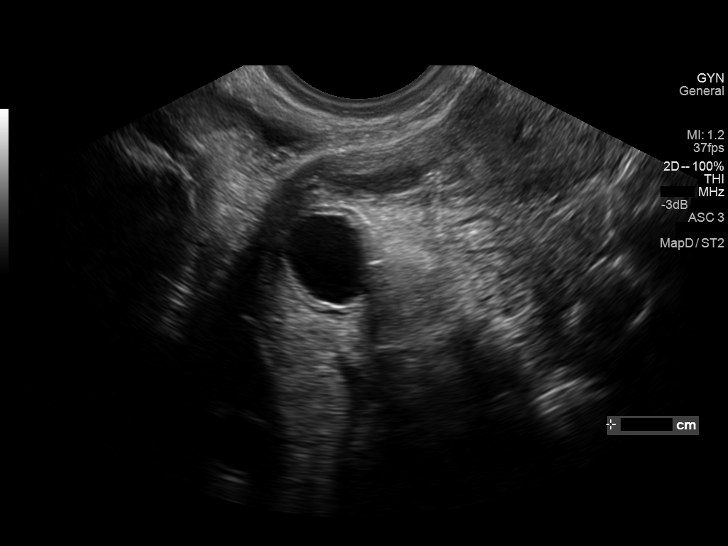
[im 91/100]
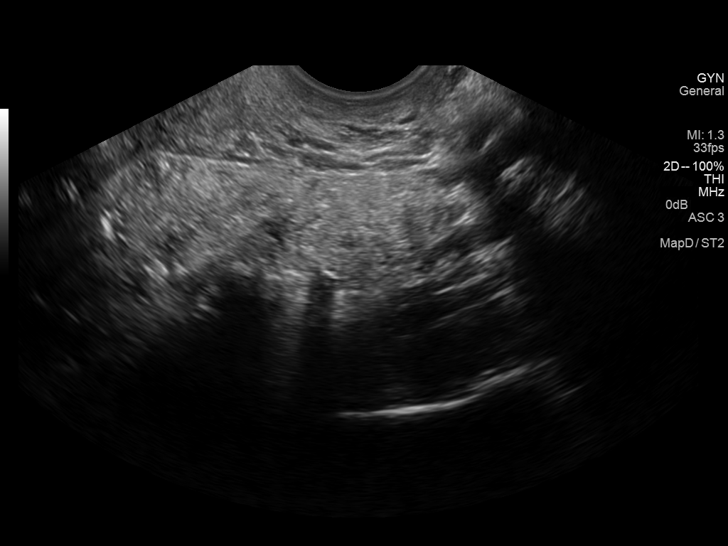
[im 100/100]
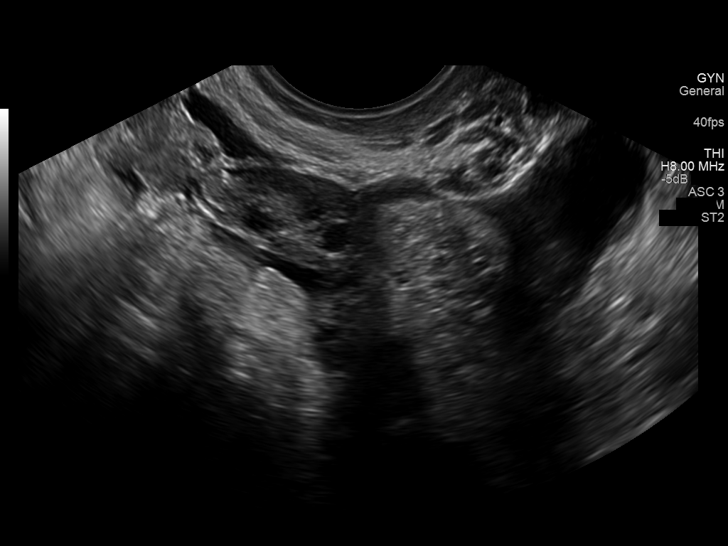

[14 of 25 positions shown; findings below may reference images not displayed]

FINDINGS: Uterus

Measurements: 76 x 31 x 53 mm. No fibroids or other mass visualized.

Endometrium

Thickness: 4 mm.  No focal abnormality visualized.

Right ovary

Measurements: 47 x 27 x 23 mm. 2.5 cm simple cyst. 3.0 cm complex
possibly hemorrhagic cyst.

Left ovary

Measurements: 27 x 24 x 15 mm. Normal appearance/no adnexal mass.

Other findings

Moderate free fluid in the cul-de-sac
IMPRESSION: In addition to a simple right ovarian cyst there is a 3 cm complex
cysts possibly representing a hemorrhagic cyst. Short-interval
follow up ultrasound in 6-12 weeks is recommended, preferably during
the week following the patient's normal menses.

## 2017-03-03 IMAGING — US US PELVIS COMPLETE
1 series · 14 of 25 positions shown · non-contrast
Comparison: 06/17/2015

CLINICAL DATA: Right-sided pelvic pain and bloating for 6 weeks.
Followup complex right ovarian cystic lesion.

EXAM:
TRANSABDOMINAL AND TRANSVAGINAL ULTRASOUND OF PELVIS
TECHNIQUE: Both transabdominal and transvaginal ultrasound examinations of the
pelvis were performed. Transabdominal technique was performed for
global imaging of the pelvis including uterus, ovaries, adnexal
regions, and pelvic cul-de-sac. It was necessary to proceed with
endovaginal exam following the transabdominal exam to visualize the
ovaries and adnexae.

[Series 1: us pelvis complete · 0.15mm/px · 14 of 83 slices shown]
[im 1/83]
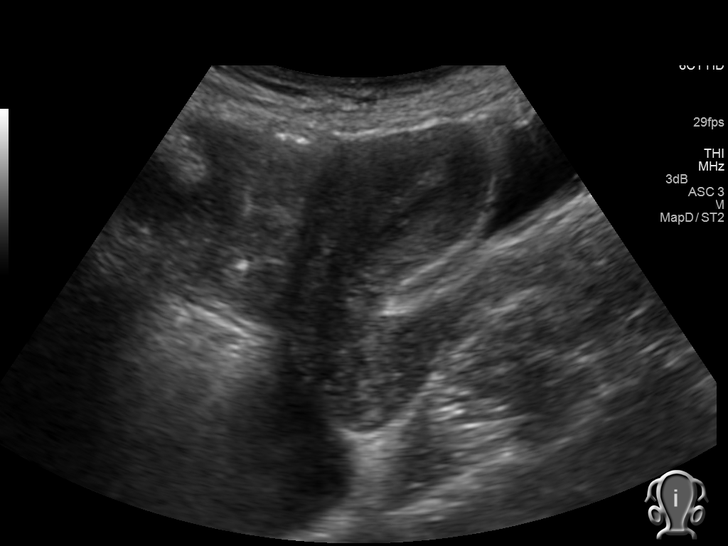
[im 7/83]
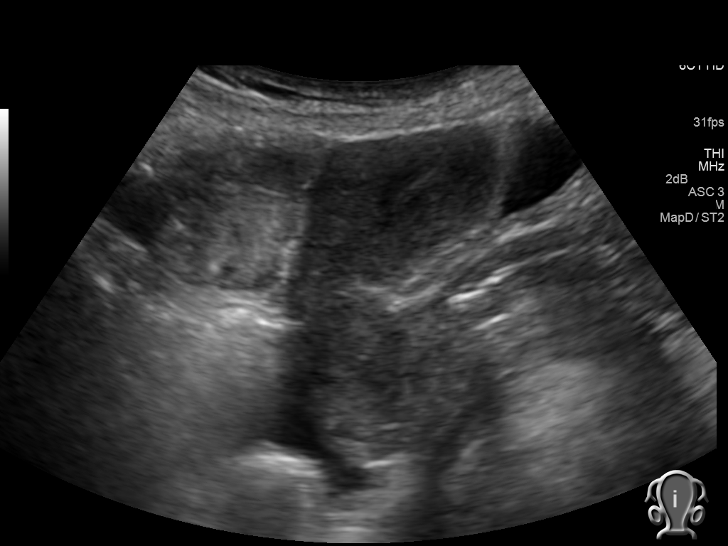
[im 14/83]
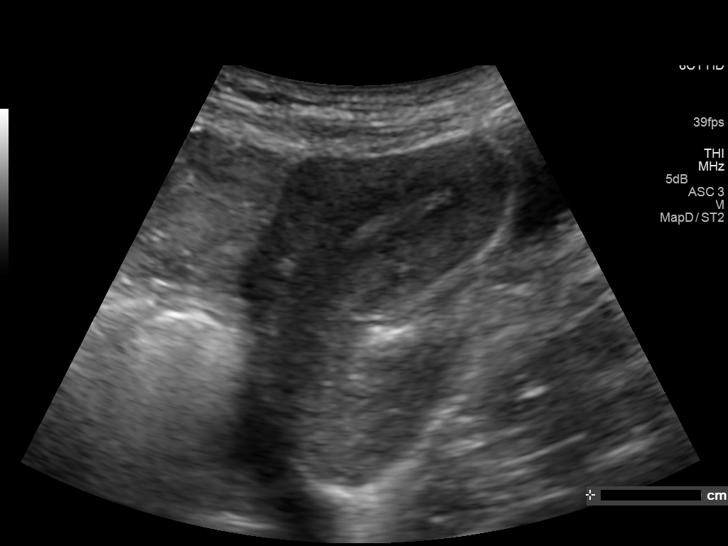
[im 21/83]
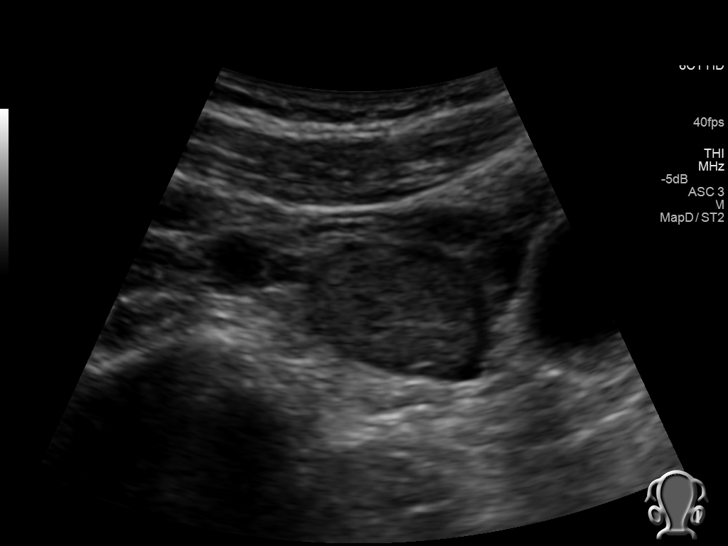
[im 28/83]
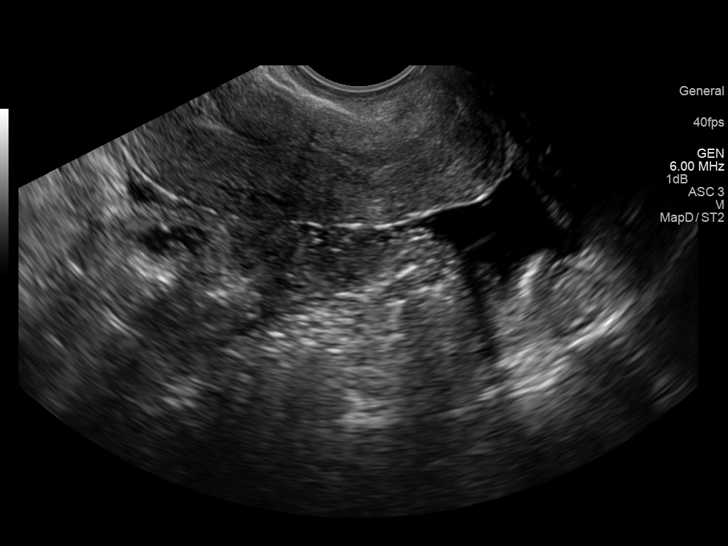
[im 31/83]
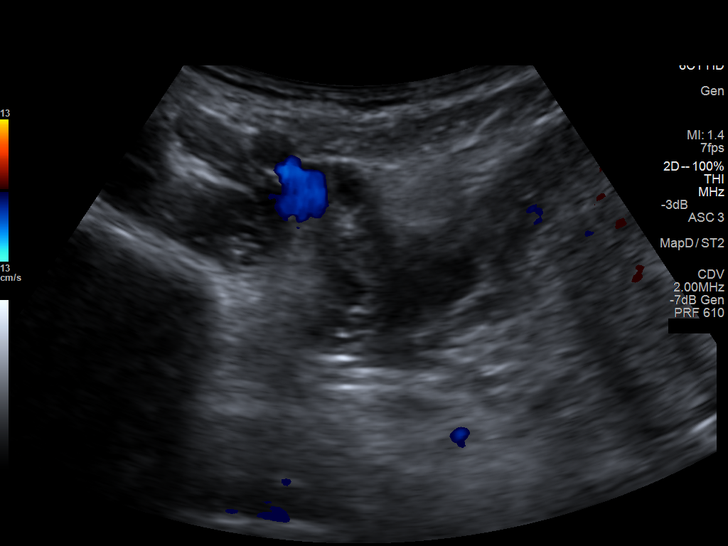
[im 38/83]
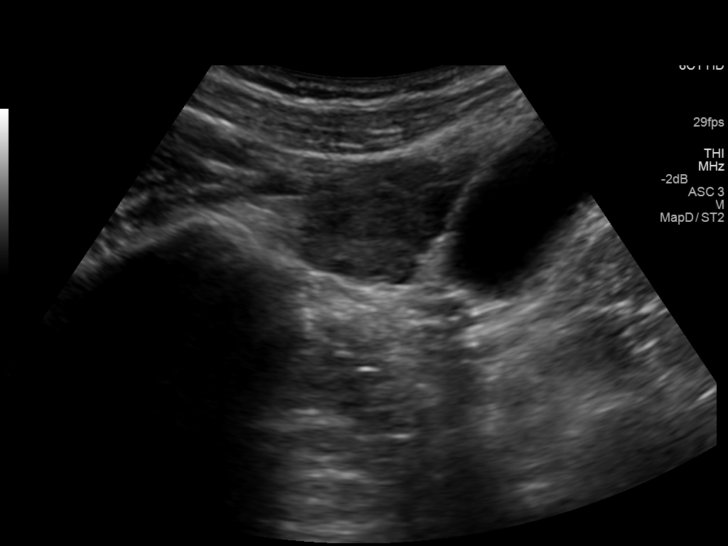
[im 45/83]
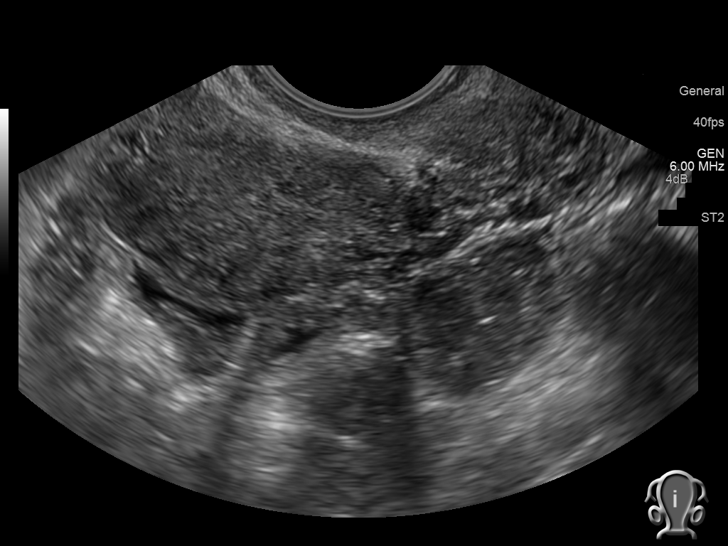
[im 52/83]
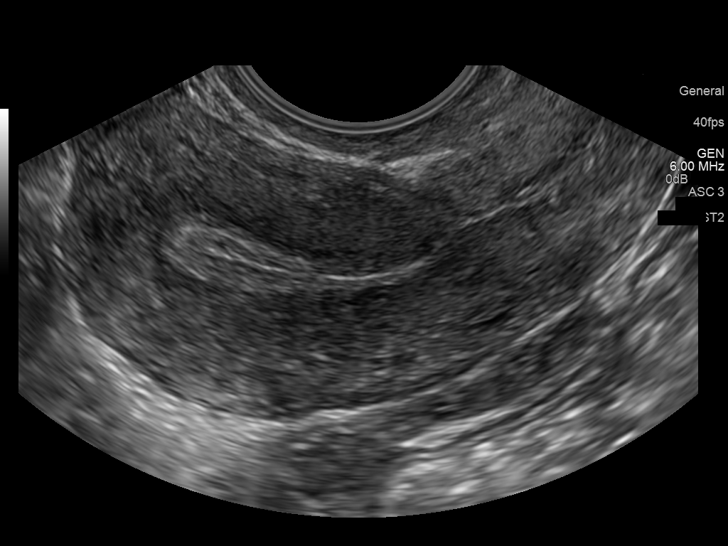
[im 55/83]
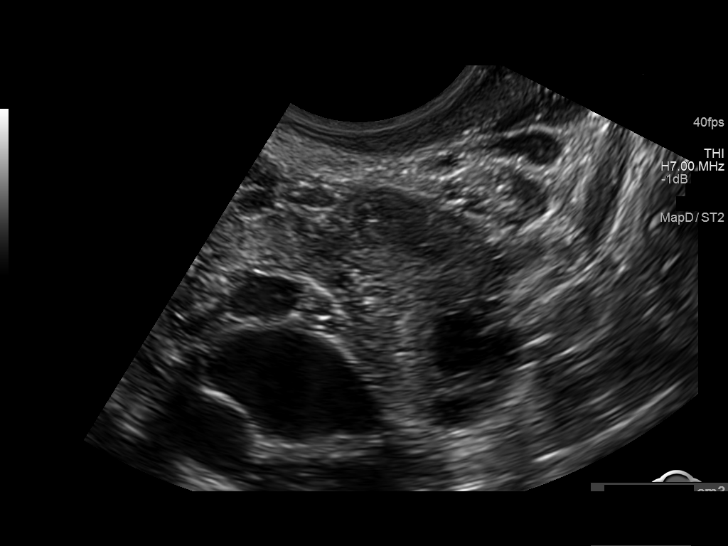
[im 62/83]
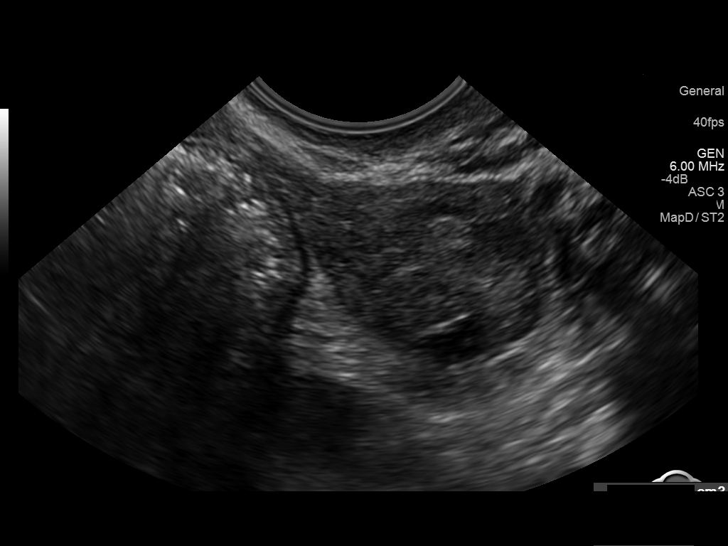
[im 69/83]
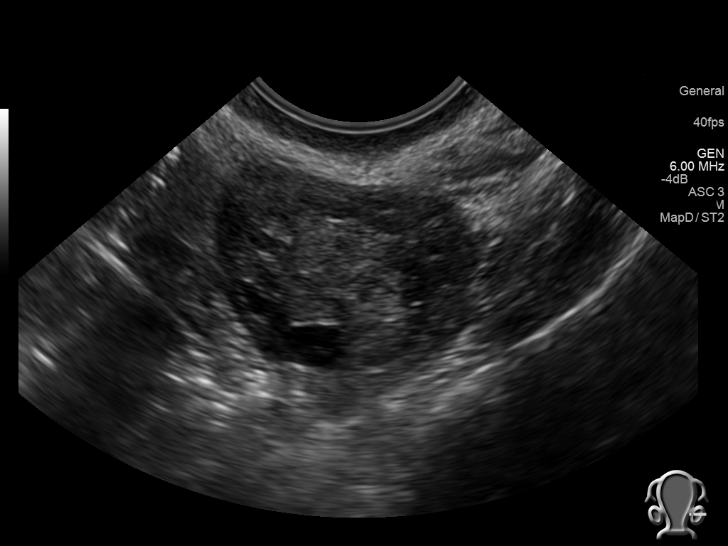
[im 76/83]
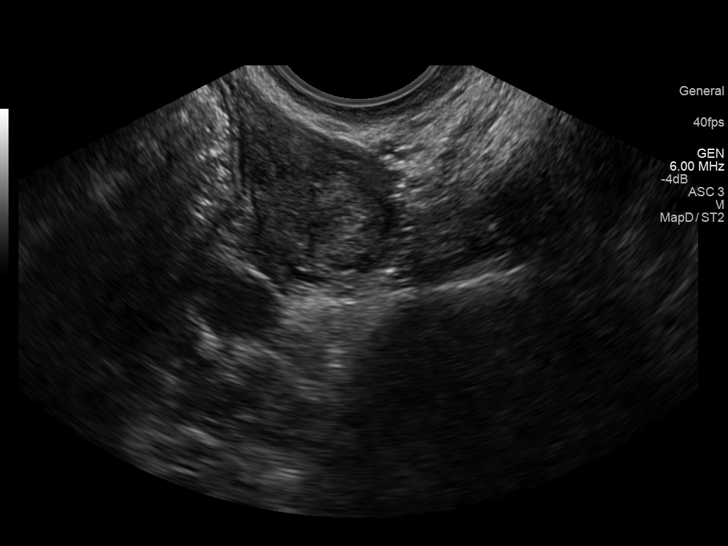
[im 83/83]
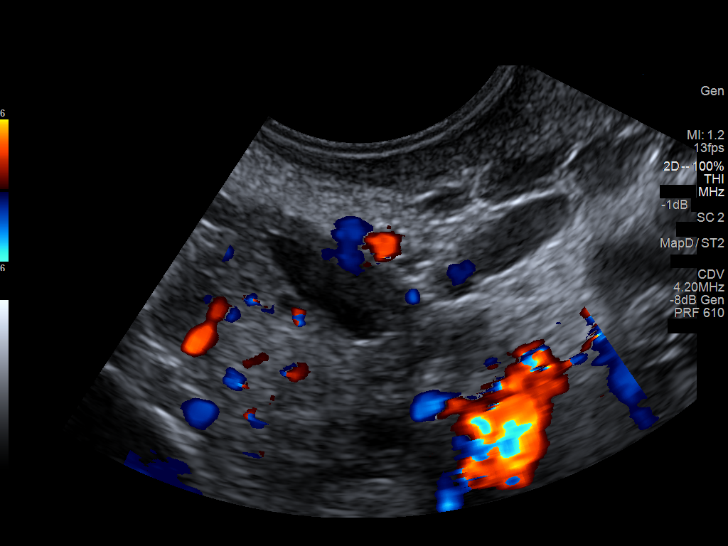

[14 of 25 positions shown; findings below may reference images not displayed]

FINDINGS: Uterus

Measurements: 7.1 x 3.3 x 4.8 cm. No fibroids or other mass
visualized.

Endometrium

Thickness: 7 mm.  No focal abnormality visualized.

Right ovary

Measurements: 2.6 x 1.7 x 2.0 cm. Normal appearance/no adnexal mass.
Previously noted complex right ovarian cyst is no longer visualized.

Left ovary

Measurements: 3.2 x 1.9 x 3.1 cm. Normal appearance/no adnexal mass.

Other findings

Trace amount of simple free fluid in pelvic cul-de-sac.
IMPRESSION: Interval resolution of hemorrhagic right ovarian cyst since prior
study. No evidence of pelvic mass or other acute findings.

## 2017-06-20 IMAGING — US US OB TRANSVAGINAL
1 series · 13 of 28 positions shown · non-contrast
Comparison: Pelvic ultrasound performed 07/26/2015

CLINICAL DATA: Acute onset of left lower quadrant abdominal pain,
radiating to the left shoulder and neck. Initial encounter.

EXAM:
OBSTETRIC <14 WK US AND TRANSVAGINAL OB US
TECHNIQUE: Both transabdominal and transvaginal ultrasound examinations were
performed for complete evaluation of the gestation as well as the
maternal uterus, adnexal regions, and pelvic cul-de-sac.
Transvaginal technique was performed to assess early pregnancy.

[Series 1: us ob transvaginal · 0.15mm/px · 13 of 73 slices shown]
[im 3/73]
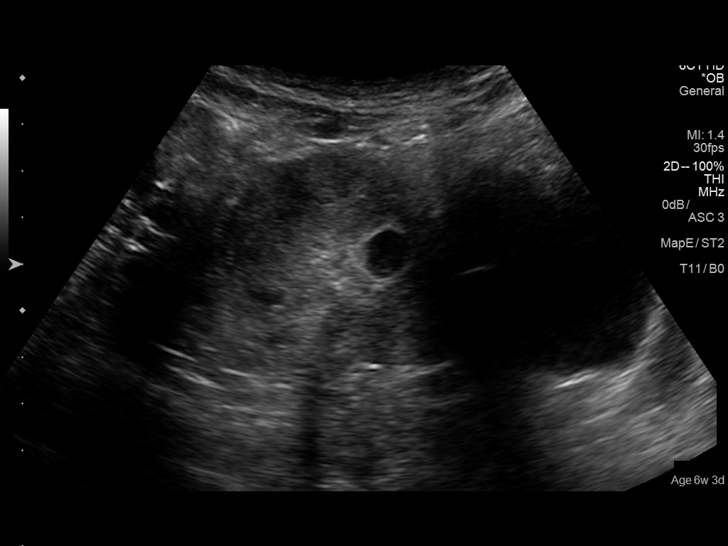
[im 9/73]
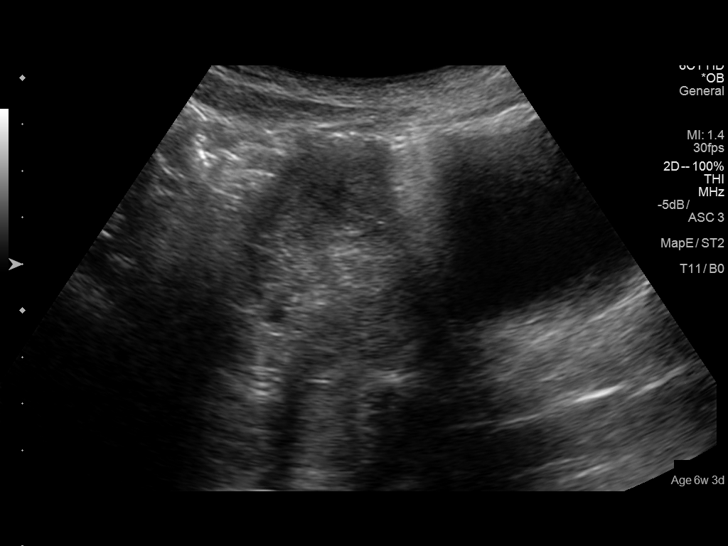
[im 14/73]
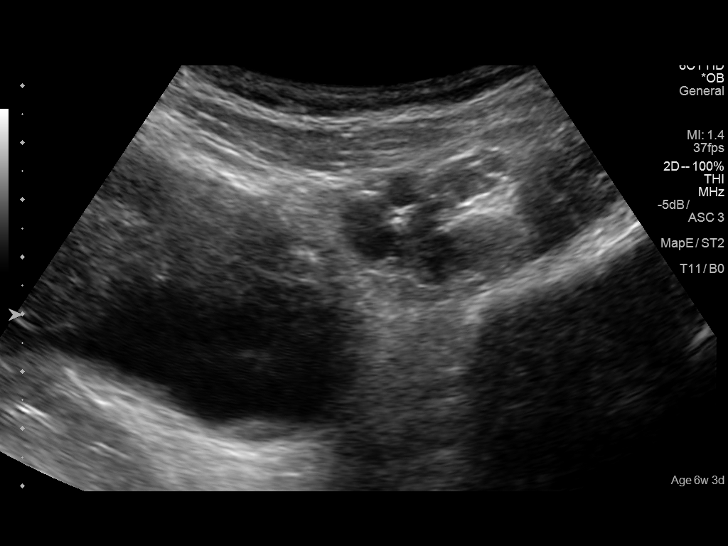
[im 19/73]
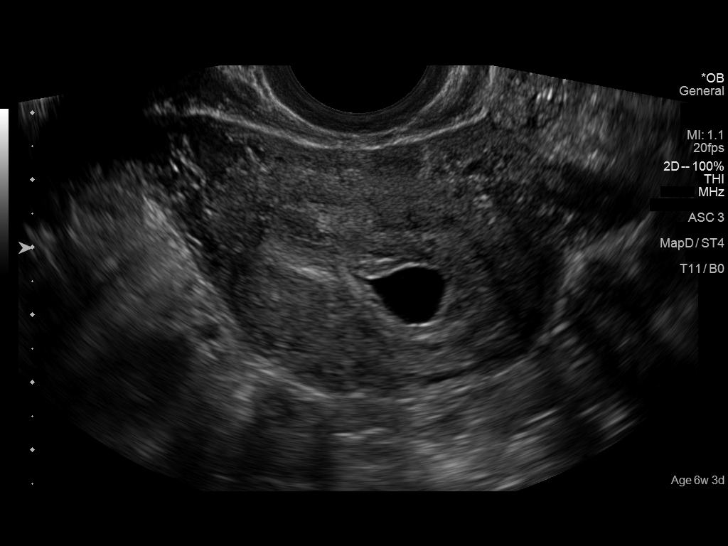
[im 25/73]
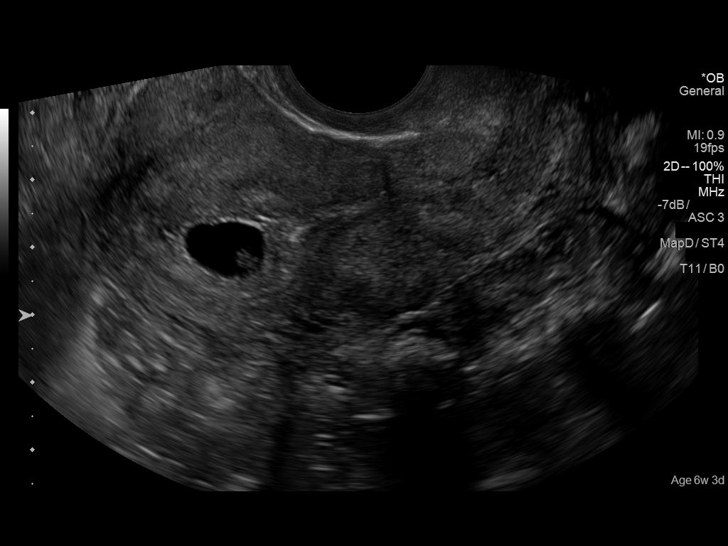
[im 30/73]
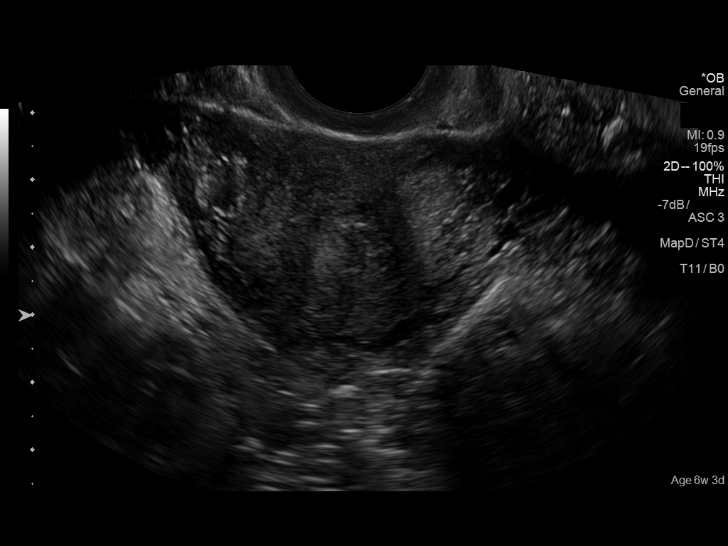
[im 38/73]
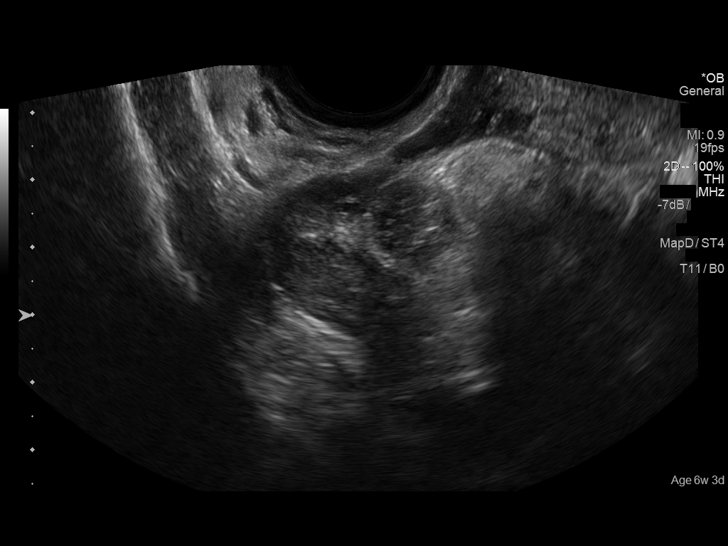
[im 43/73]
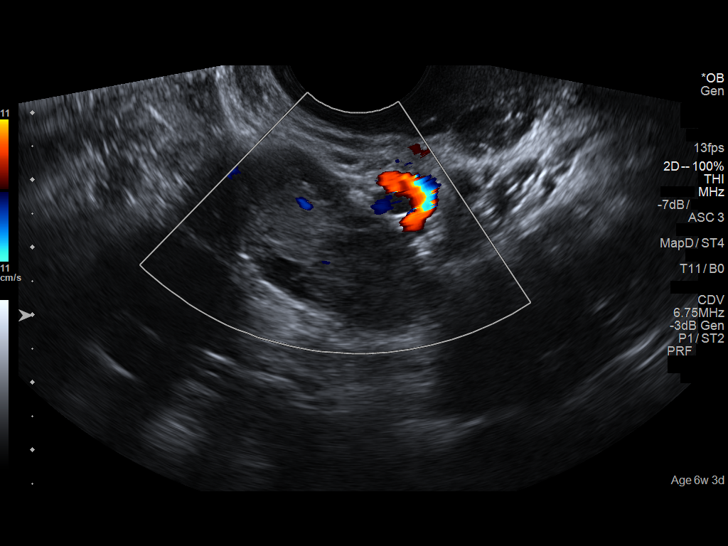
[im 49/73]
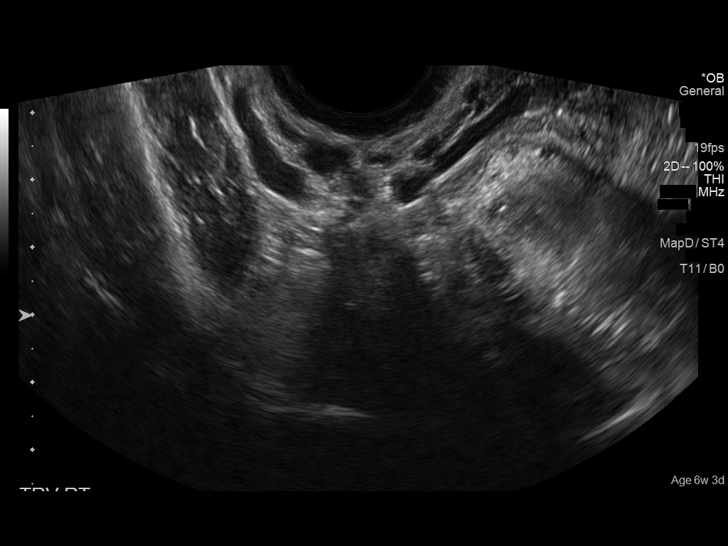
[im 54/73]
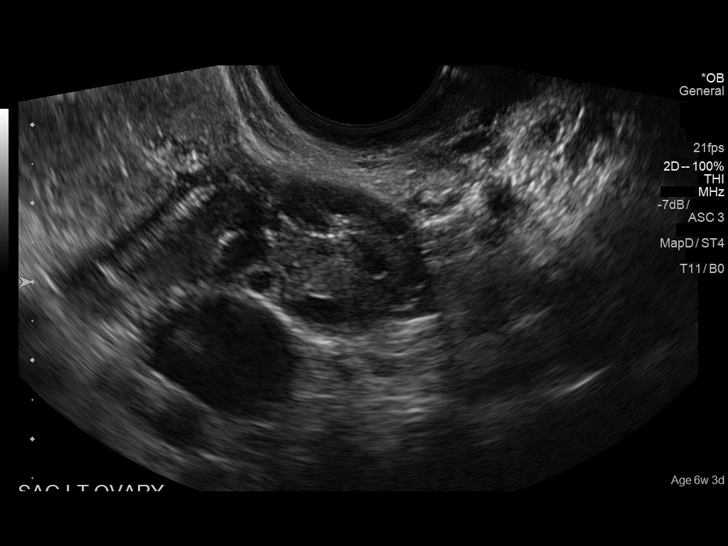
[im 59/73]
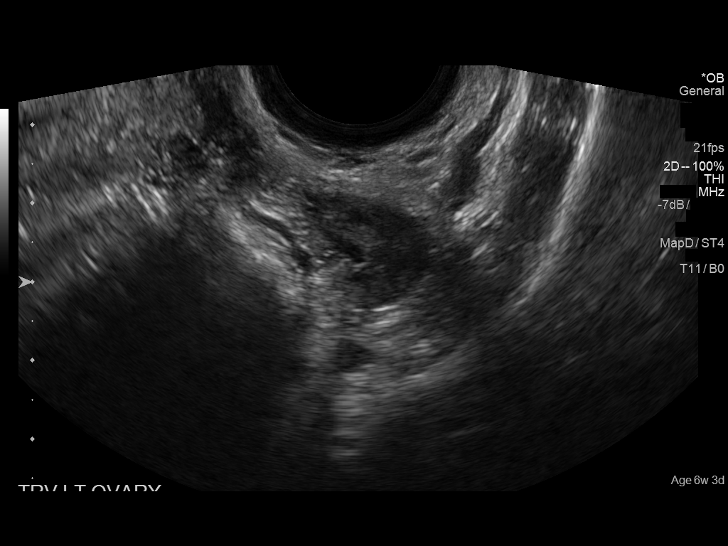
[im 65/73]
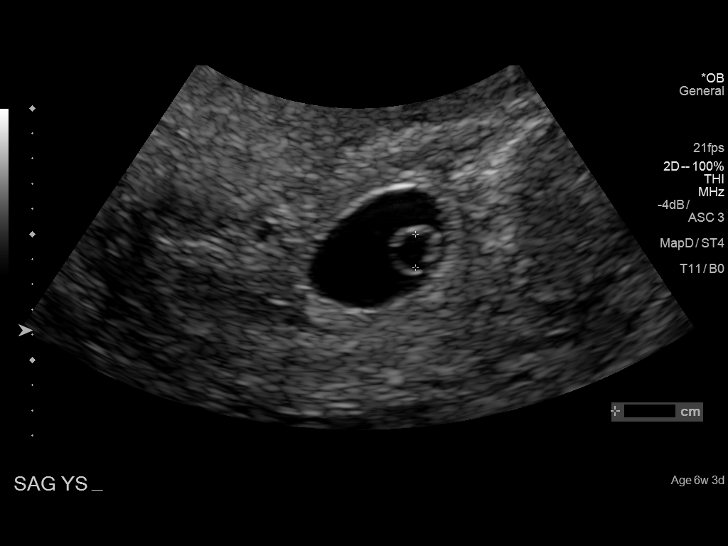
[im 70/73]
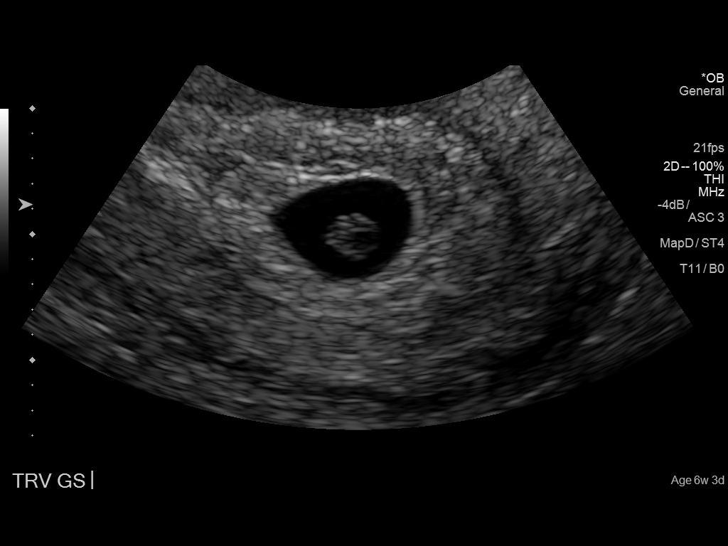

[13 of 28 positions shown; findings below may reference images not displayed]

FINDINGS: Intrauterine gestational sac: Visualized/normal in shape.

Yolk sac:  Yes

Embryo:  No

Cardiac Activity: N/A

MSD: 10.6  mm   5 w   6  d

Maternal uterus/adnexae: No subchorionic hemorrhage is noted. The
uterus is otherwise unremarkable.

The ovaries are within normal limits. The right ovary measures 3.8 x
1.8 x 1.8 cm, while the left ovary measures 3.3 x 1.9 x 1.8 cm. No
suspicious adnexal masses are seen; there is no evidence for ovarian
torsion.

No free fluid is seen within the pelvic cul-de-sac.
IMPRESSION: Single intrauterine gestational sac noted, with a mean sac diameter
of 1.1 cm, corresponding to a gestational age of 5 weeks 6 days.
This matches the gestational age of 6 weeks 3 days by LMP,
reflecting an estimated date of delivery July 04, 2016.

## 2018-10-20 ENCOUNTER — Telehealth: Payer: Self-pay | Admitting: Obstetrics & Gynecology

## 2018-10-20 NOTE — Telephone Encounter (Signed)
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 Called and left voicemail for patient to call back to be schedule

## 2018-11-09 ENCOUNTER — Other Ambulatory Visit (HOSPITAL_COMMUNITY): Admission: RE | Admit: 2018-11-09 | Payer: Self-pay | Source: Ambulatory Visit | Admitting: Obstetrics and Gynecology

## 2018-11-09 ENCOUNTER — Other Ambulatory Visit (HOSPITAL_COMMUNITY)
Admission: RE | Admit: 2018-11-09 | Discharge: 2018-11-09 | Disposition: A | Discharge status: Home / Self Care | Payer: Self-pay | Source: Ambulatory Visit | Attending: Obstetrics and Gynecology | Admitting: Obstetrics and Gynecology

## 2018-11-09 ENCOUNTER — Ambulatory Visit (INDEPENDENT_AMBULATORY_CARE_PROVIDER_SITE_OTHER): Payer: Self-pay | Admitting: Obstetrics and Gynecology

## 2018-11-09 ENCOUNTER — Encounter: Payer: Self-pay | Admitting: *Deleted

## 2018-11-09 ENCOUNTER — Encounter: Payer: Self-pay | Admitting: Obstetrics and Gynecology

## 2018-11-09 ENCOUNTER — Ambulatory Visit: Payer: Self-pay | Attending: Oncology | Admitting: *Deleted

## 2018-11-09 VITALS — BP 114/75 | HR 93 | Temp 98.4°F | Ht 64.0 in | Wt 110.0 lb

## 2018-11-09 VITALS — BP 116/80 | Wt 111.0 lb

## 2018-11-09 DIAGNOSIS — R87810 Cervical high risk human papillomavirus (HPV) DNA test positive: Secondary | ICD-10-CM | POA: Insufficient documentation

## 2018-11-09 DIAGNOSIS — R87610 Atypical squamous cells of undetermined significance on cytologic smear of cervix (ASC-US): Secondary | ICD-10-CM

## 2018-11-09 DIAGNOSIS — N871 Moderate cervical dysplasia: Secondary | ICD-10-CM

## 2018-11-09 DIAGNOSIS — R300 Dysuria: Secondary | ICD-10-CM

## 2018-11-09 LAB — POCT URINALYSIS DIPSTICK
BILIRUBIN UA: NEGATIVE
Blood, UA: NEGATIVE
GLUCOSE UA: NEGATIVE
Ketones, UA: NEGATIVE
Leukocytes, UA: NEGATIVE
Nitrite, UA: NEGATIVE
Protein, UA: NEGATIVE
Spec Grav, UA: 1.01 (ref 1.010–1.025)
Urobilinogen, UA: NEGATIVE E.U./dL — AB
pH, UA: 5 (ref 5.0–8.0)

## 2018-11-09 NOTE — Progress Notes (Addendum)
Referring Provider:  Tiana Loft, MD at [facility]  HPI:  Kathy Lynch is a 27 y.o.  [phone]  who presents today in referral from Dr. Tiana Loft at [facility] at [facility] for evaluation and management of abnormal cervical cytology.    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 Abnormal Pap smear of cervix     Past Surgical History:  Procedure Laterality Date  . CESAREAN SECTION    . CESAREAN SECTION    . WISDOM TOOTH EXTRACTION  2017    SOCIAL HISTORY:  Social History   Substance and Sexual Activity  Alcohol Use No  . Alcohol/week: 1.0 standard drinks  . Types: 1 Standard drinks or equivalent per week   Comment: Occasionally    Social History   Substance and Sexual Activity  Drug Use No     Family History  Problem Relation Age of Onset  . Lung cancer Maternal Grandmother   . Kidney cancer Maternal Grandmother   . Prostate cancer Maternal Grandfather     ALLERGIES:  Aspirin; Doxycycline; Ibuprofen; Latex; and Sulfa antibiotics  Current Outpatient Medications on File Prior to Visit  Medication Sig Dispense Refill  . Norethin Ace-Eth Estrad-FE (LOESTRIN 24 FE PO) Take 1 tablet by mouth daily.     . Prenatal Vit-Fe Fumarate-FA (MULTIVITAMIN-PRENATAL) 27-0.8 MG TABS tablet Take 1 tablet by mouth daily at 12 noon.    . Prenatal Vit-Fe Fumarate-FA (PRENATAL MULTIVITAMIN) TABS tablet Take 1 tablet by mouth daily at 12 noon.     No current facility-administered medications on file prior to visit.     Physical Exam: -Vitals:  BP 116/80 (BP Location: Left Arm, Patient Position: Sitting, Cuff Size: Small)    Wt 111 lb (50.3 kg)   LMP 10/18/2018 (Exact Date)   Breastfeeding? Unknown   BMI 19.66 kg/m  GEN: WD, WN, NAD.  A+ O x 3, good mood and affect. ABD:  NT, ND.  Soft, no masses.  No hernias noted.   Pelvic:   Vulva: Normal appearance.  No lesions.  Vagina: No lesions or abnormalities noted.  Support: Normal pelvic support.  Urethra No masses tenderness or scarring.  Meatus Normal size without lesions or prolapse.  Cervix: See below.  Anus: Normal exam.  No lesions.  Perineum: Normal exam.  No lesions.        Bimanual   Uterus: Normal size.  Non-tender.  Mobile.  AV.  Adnexae: No masses.  Non-tender to palpation.  Cul-de-sac: Negative for abnormality.   PROCEDURE: 1.  Urine Pregnancy Test:  negative 2.  Colposcopy performed with 4% acetic acid after verbal consent obtained                                         -  Aceto-white Lesions Location(s): 6 and 12 o'clock.              -Biopsy performed at 6 and 12  o'clock               -ECC indicated and performed: Yes.       -Biopsy sites made hemostatic with pressure and AgNO3   -Satisfactory colposcopy: No.    -Evidence of Invasive cervical CA :  NO  ASSESSMENT:  Kathy Lynch is a 27 y.o. [id number] here for  1. ASCUS with positive high risk HPV cervical   2. Dysuria   .  PLAN: I discussed the grading system of pap smears and HPV high risk viral types.  We will discuss and base management after colpo pathology results return.     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 She wanted to discuss an alternative. We did discuss following with colposcopy and cytology every 6 months, per the ASCCP guidelines for Young  Women.  She is leaning toward following with cytology and colposcopy.  I did reiterate that we have that we are trying to prevent her from developing cancer and that I did recommend excision (LEEP) as a first  line. She will still consider it. But, is leaning toward follow-up with colposcopy/cytology in six months.  Results released in MyChart.   CC: Tiana LoftMelissa Cabellon, MD Etheleen NicksKeri MacDonald, FNP [address] [place], [place] [address]  [facility] , [place] [id number]

## 2018-11-09 NOTE — Addendum Note (Signed)
Addended by: Thomasene MohairJACKSON, Deanta Mincey D on: 11/09/2018 03:39 PM   Modules accepted: Orders

## 2018-11-09 NOTE — Progress Notes (Signed)
27 year old White female [facility] for further evaluation of recent abnormal pap smear of HPV+ ASCUS completed by [facility] [place].  Reviewed pap results with patient.  Patient states she has a history of abnormal paps in the past and has had a colposcopy before.  States she knows what to expect.  Hand-out given on "Understanding Pap Smears".  [facility] Jean RosenthalJackson for colposcopy and possible biopsy.  States he delivered her daughter.  She is to call with any questions or needs.  Will follow up per [facility] protocol and Dr. Jean RosenthalJackson.

## 2018-11-11 ENCOUNTER — Telehealth: Payer: Self-pay | Admitting: Obstetrics and Gynecology

## 2018-11-11 NOTE — Telephone Encounter (Signed)
Discussed CIN II result with the patient. Strongly recommended LEEP procedure, as per ASCCP guidelines.She is quite nervous about undergoing this procedure. She asks if there are alternatives. The only alternative listed in ASCCP guidelines (for younger women) is to follow with colposcopy and cytology every 6 months. She is consider the latter.   She has a strong fear of medication and states that she is allergic to "everything."   Discussed risk of progression to cancer, if left untreated.  She will continue to consider and let me know.  Thomasene MohairStephen Shaughnessy Gethers, MD, Merlinda FrederickFACOG [facility], [facility] 11/11/2018 2:06 PM

## 2018-11-25 ENCOUNTER — Encounter: Payer: Self-pay | Admitting: *Deleted

## 2018-11-25 NOTE — Progress Notes (Unsigned)
Called and spoke to patient today.  She had a CIN11 on recent colposcopy and was undecided as to how she wanted to follow up.  Discussed with patient that if she choices the LEEP procedure, I will need to complete paperwork for [facility] Medicaid to cover the cost of the procedure.  Explained the importance of at minimum close follow with her next colposcopy as discussed by Dr. Jean RosenthalJackson.  Patient is still undecided on what she wants to do.  She was encouraged to let Dr. Edison PaceJackson's office know her plan.

## 2019-02-08 ENCOUNTER — Encounter: Payer: Self-pay | Admitting: *Deleted

## 2019-02-08 NOTE — Progress Notes (Signed)
Left patient a message to return my call.  She does not have an appointment scheduled for a LEEP or to follow for next colposcopy with Dr. Jean Rosenthal.  I would like to assist with scheduling an appointment.

## 2019-02-09 ENCOUNTER — Encounter: Payer: Self-pay | Admitting: *Deleted

## 2019-02-09 NOTE — Progress Notes (Signed)
Patient returned my call today.  States she does not want a LEEP procedure at this time.  States she would prefer close follow up with colposcopy.  States if she changes her mind she will let me know.  I have scheduled her to see Dr. Jean Rosenthal on 05/07/19 @ 10:30.  States she may need afternoons.  She will call his office to change the time if needed.

## 2019-05-07 ENCOUNTER — Ambulatory Visit: Payer: Medicaid Other | Admitting: Obstetrics and Gynecology

## 2019-05-20 ENCOUNTER — Ambulatory Visit (INDEPENDENT_AMBULATORY_CARE_PROVIDER_SITE_OTHER): Payer: Self-pay | Admitting: Obstetrics and Gynecology

## 2019-05-20 ENCOUNTER — Other Ambulatory Visit: Payer: Self-pay

## 2019-05-20 ENCOUNTER — Other Ambulatory Visit (HOSPITAL_COMMUNITY)
Admission: RE | Admit: 2019-05-20 | Discharge: 2019-05-20 | Disposition: A | Discharge status: Home / Self Care | Payer: Medicaid Other | Source: Ambulatory Visit | Attending: Obstetrics and Gynecology | Admitting: Obstetrics and Gynecology

## 2019-05-20 ENCOUNTER — Encounter: Payer: Self-pay | Admitting: Obstetrics and Gynecology

## 2019-05-20 VITALS — BP 104/64 | Ht 63.0 in | Wt 110.0 lb

## 2019-05-20 DIAGNOSIS — R87610 Atypical squamous cells of undetermined significance on cytologic smear of cervix (ASC-US): Secondary | ICD-10-CM | POA: Diagnosis not present

## 2019-05-20 DIAGNOSIS — R87810 Cervical high risk human papillomavirus (HPV) DNA test positive: Secondary | ICD-10-CM | POA: Diagnosis present

## 2019-05-20 DIAGNOSIS — N871 Moderate cervical dysplasia: Secondary | ICD-10-CM | POA: Diagnosis present

## 2019-05-20 NOTE — Progress Notes (Signed)
Referring Provider:  Tiana LoftMelissa Cabellon, MD at [facility]  HPI:  Netta CedarsFelisha L Furia is a 28 y.o.  [phone]  who presents today for evaluation and management of abnormal cervical cytology.    Dysplasia History:  Late 2019 - ASCUS, HPV + (HPV 16/18 negative) 10/2018 - colposcopy - CIN II in one location. Patient declined LEEP procedure.  OB History  Gravida Para Term Preterm AB Living  5 1 1   3 1   SAB TAB Ectopic Multiple Live Births  3       1    # Outcome Date GA Lbr Len/2nd Weight Sex Delivery Anes PTL Lv  5 Gravida           4 SAB           3 SAB           2 SAB           1 Term             Past Medical History:  Diagnosis Date  . Abnormal Pap smear of cervix     Past Surgical History:  Procedure Laterality Date  . CESAREAN SECTION    . CESAREAN SECTION    . WISDOM TOOTH EXTRACTION  2017    SOCIAL HISTORY:  Social History   Substance and Sexual Activity  Alcohol Use No  . Alcohol/week: 1.0 standard drinks  . Types: 1 Standard drinks or equivalent per week   Comment: Occasionally    Social History   Substance and Sexual Activity  Drug Use No     Family History  Problem Relation Age of Onset  . Lung cancer Maternal Grandmother   . Kidney cancer Maternal Grandmother   . Prostate cancer Maternal Grandfather     ALLERGIES:  Aspirin; Doxycycline; Ibuprofen; Latex; and Sulfa antibiotics  Current Outpatient Medications on File Prior to Visit  Medication Sig Dispense Refill  . Norethin Ace-Eth Estrad-FE (LOESTRIN 24 FE PO) Take 1 tablet by mouth daily.     . Prenatal Vit-Fe Fumarate-FA (MULTIVITAMIN-PRENATAL) 27-0.8 MG TABS tablet Take 1 tablet by mouth daily at 12 noon.    . Prenatal Vit-Fe Fumarate-FA (PRENATAL MULTIVITAMIN) TABS tablet Take 1 tablet by mouth daily at 12 noon.     No current facility-administered medications on file prior to visit.     Physical Exam: -Vitals:  BP 104/64   Ht 5\' 3"  (1.6 m)   Wt  110 lb (49.9 kg)   LMP 05/04/2019   BMI 19.49 kg/m  GEN: WD, WN, NAD.  A+ O x 3, good mood and affect. ABD:  NT, ND.  Soft, no masses.  No hernias noted.   Pelvic:   Vulva: Normal appearance.  No lesions.  Vagina: No lesions or abnormalities noted.  Support: Normal pelvic support.  Urethra No masses tenderness or scarring.  Meatus Normal size without lesions or prolapse.  Cervix: See below.  Anus: Normal exam.  No lesions.  Perineum: Normal exam.  No lesions.        Bimanual   Uterus: Normal size.  Non-tender.  Mobile.  AV.  Adnexae: No masses.  Non-tender to palpation.  Cul-de-sac: Negative for abnormality.   PROCEDURE: 1.  Urine Pregnancy Test:  negative 2.  Colposcopy performed with 4% acetic acid after verbal consent obtained                                         -  Aceto-white Lesions Location(s): diffusely, mild o'clock.              -Biopsy performed at 6, 9, 12 o'clock               -ECC indicated and performed: Yes.       -Biopsy sites made hemostatic with pressure, AgNO3   -Satisfactory colposcopy: No.    -Evidence of Invasive cervical CA :  NO Physical Exam Genitourinary:        ASSESSMENT:  MALAAK EBELING is a 27 y.o. [id number] here for  1. ASCUS with positive high risk HPV cervical   2. CIN II (cervical intraepithelial neoplasia II)   .  PLAN: I discussed the grading system of pap smears and HPV high risk viral types.  We will discuss and base management after colpo results return.     Thomasene Mohair, MD  [facility], [facility] 05/20/2019  1:17 PM   CC: Tiana Loft, MD Etheleen Nicks, FNP [address] [place], [place] [address]  [facility] [place], [place] [address]

## 2019-05-21 ENCOUNTER — Ambulatory Visit: Payer: Medicaid Other | Admitting: Obstetrics and Gynecology

## 2019-11-26 ENCOUNTER — Encounter: Payer: Self-pay | Admitting: Obstetrics and Gynecology

## 2019-11-26 ENCOUNTER — Ambulatory Visit (INDEPENDENT_AMBULATORY_CARE_PROVIDER_SITE_OTHER): Payer: Medicaid Other | Admitting: Obstetrics and Gynecology

## 2019-11-26 ENCOUNTER — Other Ambulatory Visit: Payer: Self-pay

## 2019-11-26 VITALS — BP 118/74 | Wt 118.0 lb

## 2019-11-26 DIAGNOSIS — N83202 Unspecified ovarian cyst, left side: Secondary | ICD-10-CM

## 2019-11-26 NOTE — Progress Notes (Signed)
Obstetrics & Gynecology Office Visit   Chief Complaint  Patient presents with  . Pelvic Pain   History of Present Illness: 28 y.o. [id number] female who presents for a lump on her left abdomen.  She believes it is in the area of her left ovary. She first noticed the lump about a month ago.  She describe at as the size of a walnut.  It seems to move around when she presses on the area. The area is a little tender. The lump has changed in size since she first noticed it.  She denies blood in her stool and urine. She states that she is a little constipated.  But, this is improving.  Her periods are regular, but she is passing larger clots.  Her periods last about 7 days.  She believes she has gained weight in her lower abdomen.     Past Medical History:  Diagnosis Date  . Abnormal Pap smear of cervix    Past Surgical History:  Procedure Laterality Date  . CESAREAN SECTION    . CESAREAN SECTION    . WISDOM TOOTH EXTRACTION  2017   Gynecologic History: Patient's last menstrual period was 11/23/2019.  Obstetric History: [id number]  Family History  Problem Relation Age of Onset  . Lung cancer Maternal Grandmother   . Kidney cancer Maternal Grandmother   . Prostate cancer Maternal Grandfather     Social History   Socioeconomic History  . Marital status: Divorced    Spouse name: Not on file  . Number of children: Not on file  . Years of education: Not on file  . Highest education level: Not on file  Occupational History  . Not on file  Social Needs  . Financial resource strain: Not on file  . Food insecurity    Worry: Not on file    Inability: Not on file  . Transportation needs    Medical: Not on file    Non-medical: Not on file  Tobacco Use  . Smoking status: Former Games developer  . Smokeless tobacco: Never Used  Substance and Sexual Activity  . Alcohol use: No    Alcohol/week: 1.0 standard drinks    Types: 1 Standard drinks or equivalent per week    Comment: Occasionally  .  Drug use: No  . Sexual activity: Yes    Birth control/protection: None  Lifestyle  . Physical activity    Days per week: Not on file    Minutes per session: Not on file  . Stress: Not on file  Relationships  . Social Musician on phone: Not on file    Gets together: Not on file    Attends religious service: Not on file    Active member of club or organization: Not on file    Attends meetings of clubs or organizations: Not on file    Relationship status: Not on file  . Intimate partner violence    Fear of current or ex partner: Not on file    Emotionally abused: Not on file    Physically abused: Not on file    Forced sexual activity: Not on file  Other Topics Concern  . Not on file  Social History Narrative  . Not on file   Allergies  Allergen Reactions  . Aspirin Anaphylaxis  . Doxycycline Swelling  . Ibuprofen Swelling  . Latex Itching  . Sulfa Antibiotics     Prior to Admission medications   Medication Sig Start Date End Date Taking?  Authorizing Provider  cetirizine (ZYRTEC) 10 MG tablet Take by mouth. 12/19/17 09/24/20 Yes [provider]  Prenatal Vit-Fe Fumarate-FA (MULTIVITAMIN-PRENATAL) 27-0.8 MG TABS tablet Take 1 tablet by mouth daily at 12 noon.    [provider]  Prenatal Vit-Fe Fumarate-FA (PRENATAL MULTIVITAMIN) TABS tablet Take 1 tablet by mouth daily at 12 noon.    [provider]  Vitamin D Probiotics  Review of Systems  Constitutional: Negative.   HENT: Negative.   Eyes: Negative.   Respiratory: Negative.   Cardiovascular: Negative.   Gastrointestinal: Negative.   Genitourinary: Negative.   Musculoskeletal: Negative.   Skin: Negative.   Neurological: Positive for headaches. Negative for dizziness, tingling, tremors, sensory change, speech change, focal weakness, seizures, loss of consciousness and weakness.  Psychiatric/Behavioral: Positive for depression. Negative for hallucinations, memory loss, substance  abuse and suicidal ideas. The patient is not nervous/anxious and does not have insomnia.      Physical Exam BP 118/74   Wt 118 lb (53.5 kg)   LMP 11/23/2019   BMI 20.90 kg/m  Patient's last menstrual period was 11/23/2019. Physical Exam Constitutional:      General: She is not in acute distress.    Appearance: Normal appearance.  Genitourinary:     Pelvic exam was performed with patient in the lithotomy position.     Vulva, inguinal canal, urethra and right adnexa normal.     Left adnexa tender, full and mobile.     Genitourinary Comments: Left adnexal fullness that coincides with her pain and location of palpated mass. About 5 cm in size.   HENT:     Head: Normocephalic and atraumatic.  Eyes:     General: No scleral icterus.    Conjunctiva/sclera: Conjunctivae normal.  Abdominal:     General: There is no distension.     Palpations: Abdomen is soft. There is no mass.     Tenderness: There is no abdominal tenderness. There is no guarding or rebound.     Hernia: No hernia is present.  Neurological:     General: No focal deficit present.     Mental Status: She is alert and oriented to person, place, and time.     Cranial Nerves: No cranial nerve deficit.  Psychiatric:        Mood and Affect: Mood normal.        Behavior: Behavior normal.        Judgment: Judgment normal.     Female chaperone present for pelvic and breast  portions of the physical exam  Assessment: 28 y.o. [id number] female here for  1. Left ovarian cyst      Plan: Problem List Items Addressed This Visit    None    Visit Diagnoses    Left ovarian cyst    -  Primary   Relevant Orders   US Transvaginal Non-OB ([facility])     Discussed that we could obtain a pelvic ultrasound to better characterize her finding. She agrees to this.  We discussed watchful waiting with follow up in 6-10 weeks if not improved. She elects an ultrasound. Reviewed symptoms of torsion.  For history of CIN II: recommend she make  follow up colposcopy visit for as soon as she can (she was due in 10/2019). She voiced understanding and agreement.   15 minutes spent in face to face discussion with > 50% spent in counseling,management, and coordination of care of her left ovarian cyst and history of CIN II.   Prentice Docker, MD 11/26/2019 1:55  PM

## 2019-12-13 ENCOUNTER — Ambulatory Visit: Payer: Medicaid Other | Admitting: Obstetrics and Gynecology

## 2019-12-20 ENCOUNTER — Ambulatory Visit (INDEPENDENT_AMBULATORY_CARE_PROVIDER_SITE_OTHER): Payer: Medicaid Other | Admitting: Obstetrics and Gynecology

## 2019-12-20 ENCOUNTER — Ambulatory Visit (INDEPENDENT_AMBULATORY_CARE_PROVIDER_SITE_OTHER): Payer: Medicaid Other

## 2019-12-20 ENCOUNTER — Encounter: Payer: Self-pay | Admitting: Obstetrics and Gynecology

## 2019-12-20 ENCOUNTER — Other Ambulatory Visit: Payer: Self-pay

## 2019-12-20 VITALS — BP 110/70 | Ht 63.0 in | Wt 118.0 lb

## 2019-12-20 DIAGNOSIS — N83202 Unspecified ovarian cyst, left side: Secondary | ICD-10-CM

## 2019-12-20 DIAGNOSIS — R1032 Left lower quadrant pain: Secondary | ICD-10-CM | POA: Diagnosis not present

## 2019-12-20 NOTE — Progress Notes (Signed)
Gynecology Ultrasound Follow Up   Chief Complaint  Patient presents with  . Follow-up  U/S for left lower quadrant pelvic pain and possible left ovarian cyst   History of Present Illness: Patient is a 28 y.o. female who presents today for ultrasound evaluation of the above.  Ultrasound demonstrates the following findings Adnexa: no masses seen  Uterus: anteverted with endometrial stripe  7.8 mm Additional: no abnormalities  She continues to have pain in the left lower quadrant  Past Medical History:  Diagnosis Date  . Abnormal Pap smear of cervix     Past Surgical History:  Procedure Laterality Date  . CESAREAN SECTION    . CESAREAN SECTION    . WISDOM TOOTH EXTRACTION  2017    Family History  Problem Relation Age of Onset  . Lung cancer Maternal Grandmother   . Kidney cancer Maternal Grandmother   . Prostate cancer Maternal Grandfather     Social History   Socioeconomic History  . Marital status: Divorced    Spouse name: Not on file  . Number of children: Not on file  . Years of education: Not on file  . Highest education level: Not on file  Occupational History  . Not on file  Tobacco Use  . Smoking status: Former Research scientist (life sciences)  . Smokeless tobacco: Never Used  Substance and Sexual Activity  . Alcohol use: No    Alcohol/week: 1.0 standard drinks    Types: 1 Standard drinks or equivalent per week    Comment: Occasionally  . Drug use: No  . Sexual activity: Yes    Birth control/protection: None  Other Topics Concern  . Not on file  Social History Narrative  . Not on file   Social Determinants of Health   Financial Resource Strain:   . Difficulty of Paying Living Expenses: Not on file  Food Insecurity:   . Worried About Charity fundraiser in the Last Year: Not on file  . Ran Out of Food in the Last Year: Not on file  Transportation Needs:   . Lack of Transportation (Medical): Not on file  . Lack of Transportation (Non-Medical): Not on file    Physical Activity:   . Days of Exercise per Week: Not on file  . Minutes of Exercise per Session: Not on file  Stress:   . Feeling of Stress : Not on file  Social Connections:   . Frequency of Communication with Friends and Family: Not on file  . Frequency of Social Gatherings with Friends and Family: Not on file  . Attends Religious Services: Not on file  . Active Member of Clubs or Organizations: Not on file  . Attends Archivist Meetings: Not on file  . Marital Status: Not on file  Intimate Partner Violence:   . Fear of Current or Ex-Partner: Not on file  . Emotionally Abused: Not on file  . Physically Abused: Not on file  . Sexually Abused: Not on file    Allergies  Allergen Reactions  . Aspirin Anaphylaxis  . Doxycycline Swelling  . Ibuprofen Swelling  . Latex Itching  . Sulfa Antibiotics     Prior to Admission medications   Medication Sig Start Date End Date Taking? Authorizing Provider  cetirizine (ZYRTEC) 10 MG tablet Take by mouth. 12/19/17 09/24/20  [provider]  Norethin Ace-Eth Estrad-FE (LOESTRIN 24 FE PO) Take 1 tablet by mouth daily.     [provider]  Prenatal Vit-Fe Fumarate-FA (MULTIVITAMIN-PRENATAL) 27-0.8 MG TABS  tablet Take 1 tablet by mouth daily at 12 noon.    [provider]  Prenatal Vit-Fe Fumarate-FA (PRENATAL MULTIVITAMIN) TABS tablet Take 1 tablet by mouth daily at 12 noon.    [provider]    Physical Exam BP 110/70   Ht 5\' 3"  (1.6 m)   Wt 118 lb (53.5 kg)   LMP 11/23/2019   BMI 20.90 kg/m    General: NAD HEENT: normocephalic, anicteric Pulmonary: No increased work of breathing Extremities: no edema, erythema, or tenderness Neurologic: Grossly intact, normal gait Psychiatric: mood appropriate, affect full  Imaging Results 14/12/2018 Transvaginal Non-OB [facility])  Result Date: 12/20/2019 Patient Name: NISREEN GUISE DOB: [birth year] MRN: [id number] ULTRASOUND REPORT Location: [facility]  Date of Service: 12/20/2019 Indications: evaluate for an ovarian cyst Findings: The uterus is anteverted and measures 7.5 x 4.8 x 3.8 cm . Echo texture is homogenous without evidence of focal masses. The Endometrium measures 7.8 mm. Right Ovary measures 2.4 x 1.6 x 1.5 cm. It is normal in appearance. Left Ovary measures 2.8 x 1.8 x 1.7 cm. It is normal in appearance. Survey of the adnexa demonstrates no adnexal masses. There is no free fluid in the cul de sac. Impression: 1. Normal pelvic ultrasound. 12/22/2019, RT The ultrasound images and findings were reviewed by me and I agree with the above report. Deanna Artis, MD, [facility], [facility] 12/20/2019 5:28 PM       Assessment: 28 y.o. G5P1031  1. Left ovarian cyst   2. Left lower quadrant pain      Plan: Problem List Items Addressed This Visit    None    Visit Diagnoses    Left ovarian cyst    -  Primary   Left lower quadrant pain         Patient reassured.  Discussed other etiologies for pain.  No treatment at this time. Will see her for her colposcopy next week.  15 minutes spent in face to face discussion with > 50% spent in counseling,management, and coordination of care of her left lower quadrant abdominal pain and left ovarian cyst.   37, MD, [facility], [facility] 12/20/2019 5:40 PM

## 2019-12-27 ENCOUNTER — Ambulatory Visit (INDEPENDENT_AMBULATORY_CARE_PROVIDER_SITE_OTHER): Payer: Medicaid Other | Admitting: Obstetrics and Gynecology

## 2019-12-27 ENCOUNTER — Other Ambulatory Visit: Payer: Self-pay

## 2019-12-27 ENCOUNTER — Encounter: Payer: Self-pay | Admitting: Obstetrics and Gynecology

## 2019-12-27 ENCOUNTER — Other Ambulatory Visit (HOSPITAL_COMMUNITY)
Admission: RE | Admit: 2019-12-27 | Discharge: 2019-12-27 | Disposition: A | Discharge status: Home / Self Care | Payer: Medicaid Other | Source: Ambulatory Visit | Attending: Obstetrics and Gynecology | Admitting: Obstetrics and Gynecology

## 2019-12-27 VITALS — BP 110/70 | Ht 63.0 in | Wt 118.0 lb

## 2019-12-27 DIAGNOSIS — N871 Moderate cervical dysplasia: Secondary | ICD-10-CM | POA: Insufficient documentation

## 2019-12-27 DIAGNOSIS — R87612 Low grade squamous intraepithelial lesion on cytologic smear of cervix (LGSIL): Secondary | ICD-10-CM

## 2019-12-27 NOTE — Progress Notes (Signed)
HPI:  Kathy Lynch is a 29 y.o.  [phone]  who presents today for evaluation and management of abnormal cervical cytology.    Dysplasia History:   Late 2019 - ASCUS, HPV + (HPV 16/18 negative): colposcopy - inflammatory atypia with no dysplasia or malignancy.  10/2018 - colposcopy - CIN II in one location. Patient declined LEEP procedure.  OB History  Gravida Para Term Preterm AB Living  5 1 1   3 1   SAB TAB Ectopic Multiple Live Births  3       1    # Outcome Date GA Lbr Len/2nd Weight Sex Delivery Anes PTL Lv  5 Gravida           4 SAB           3 SAB           2 SAB           1 Term             Past Medical History:  Diagnosis Date  . Abnormal Pap smear of cervix     Past Surgical History:  Procedure Laterality Date  . CESAREAN SECTION    . CESAREAN SECTION    . WISDOM TOOTH EXTRACTION  2017    SOCIAL HISTORY:  Social History   Substance and Sexual Activity  Alcohol Use No  . Alcohol/week: 1.0 standard drinks  . Types: 1 Standard drinks or equivalent per week   Comment: Occasionally    Social History   Substance and Sexual Activity  Drug Use No     Family History  Problem Relation Age of Onset  . Lung cancer Maternal Grandmother   . Kidney cancer Maternal Grandmother   . Prostate cancer Maternal Grandfather     ALLERGIES:  Aspirin, Doxycycline, Ibuprofen, Latex, and Sulfa antibiotics  Current Outpatient Medications on File Prior to Visit  Medication Sig Dispense Refill  . Ascorbic Acid (VITAMIN C PO) Take by mouth.    . cetirizine (ZYRTEC) 10 MG tablet Take by mouth.    . Probiotic Product (PROBIOTIC DAILY) CAPS Take by mouth.     No current facility-administered medications on file prior to visit.    Physical Exam: -Vitals:  BP 110/70   Ht 5\' 3"  (1.6 m)   Wt 118 lb (53.5 kg)   LMP 12/21/2019 (Exact Date)   Breastfeeding No   BMI 20.90 kg/m  GEN: WD, WN, NAD.  A+ O x 3, good mood and affect. ABD:  NT, ND.  Soft, no masses.  No  hernias noted.   Pelvic:   Vulva: Normal appearance.  No lesions.  Vagina: No lesions or abnormalities noted.  Support: Normal pelvic support.  Urethra No masses tenderness or scarring.  Meatus Normal size without lesions or prolapse.  Cervix: See below.  Anus: Normal exam.  No lesions.  Perineum: Normal exam.  No lesions.        Bimanual   Uterus: Normal size.  Non-tender.  Mobile.  AV.  Adnexae: No masses.  Non-tender to palpation.  Cul-de-sac: Negative for abnormality.   PROCEDURE: 1.  Urine Pregnancy Test:  negative 2.  Colposcopy performed with 4% acetic acid after verbal consent obtained                                         -Aceto-white Lesions Location(s): 12 and 4 o'clock.              -  Biopsy performed at 4 and 12 o'clock               -ECC indicated and performed: Yes.       -Biopsy sites made hemostatic with pressure, AgNO3, and/or Monsel's solution   -Satisfactory colposcopy: No.    -Evidence of Invasive cervical CA :  NO  ASSESSMENT:  Kathy Lynch is a 29 y.o. [id number] here for  1. CIN II (cervical intraepithelial neoplasia II)   .  PLAN: I discussed the grading system of pap smears and HPV high risk viral types.  We will discuss and base management after colpo results return.     Thomasene Mohair, MD  [facility], [facility] 12/27/2019  11:36 AM

## 2019-12-28 LAB — SURGICAL PATHOLOGY

## 2019-12-30 LAB — CYTOLOGY - PAP
Comment: NEGATIVE
Diagnosis: UNDETERMINED — AB
High risk HPV: NEGATIVE
